# Patient Record
Sex: Female | Born: 1985 | Race: Black or African American | Hispanic: No | Marital: Single | Smoking: Never smoker
Health system: Southern US, Community
[De-identification: ages and names within clinical notes are randomized; demographics above are authoritative.]

## PROBLEM LIST (undated history)

## (undated) ENCOUNTER — Inpatient Hospital Stay (HOSPITAL_COMMUNITY): Payer: Self-pay

## (undated) DIAGNOSIS — Z3490 Encounter for supervision of normal pregnancy, unspecified, unspecified trimester: Secondary | ICD-10-CM

## (undated) DIAGNOSIS — Z789 Other specified health status: Secondary | ICD-10-CM

## (undated) DIAGNOSIS — A599 Trichomoniasis, unspecified: Secondary | ICD-10-CM

## (undated) HISTORY — PX: WISDOM TOOTH EXTRACTION: SHX21

---
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
## 2008-01-23 ENCOUNTER — Emergency Department (HOSPITAL_COMMUNITY): Admission: EM | Admit: 2008-01-23 | Discharge: 2008-01-23 | Payer: Self-pay | Admitting: Family Medicine

## 2008-02-22 ENCOUNTER — Emergency Department (HOSPITAL_COMMUNITY): Admission: EM | Admit: 2008-02-22 | Discharge: 2008-02-22 | Payer: Self-pay | Admitting: Emergency Medicine

## 2009-02-09 ENCOUNTER — Emergency Department (HOSPITAL_COMMUNITY): Admission: EM | Admit: 2009-02-09 | Discharge: 2009-02-09 | Payer: Self-pay | Admitting: Family Medicine

## 2009-09-03 ENCOUNTER — Emergency Department (HOSPITAL_COMMUNITY): Admission: EM | Admit: 2009-09-03 | Discharge: 2009-09-04 | Payer: Self-pay | Admitting: Emergency Medicine

## 2010-01-12 ENCOUNTER — Emergency Department (HOSPITAL_COMMUNITY): Admission: EM | Admit: 2010-01-12 | Discharge: 2010-01-12 | Payer: Self-pay | Admitting: Emergency Medicine

## 2010-03-13 ENCOUNTER — Inpatient Hospital Stay (HOSPITAL_COMMUNITY): Admission: AD | Admit: 2010-03-13 | Discharge: 2010-03-14 | Payer: Self-pay | Admitting: Obstetrics & Gynecology

## 2010-03-26 ENCOUNTER — Emergency Department (HOSPITAL_COMMUNITY): Admission: EM | Admit: 2010-03-26 | Discharge: 2010-03-26 | Payer: Self-pay | Admitting: Emergency Medicine

## 2010-06-24 ENCOUNTER — Ambulatory Visit: Payer: Self-pay | Admitting: Obstetrics and Gynecology

## 2010-06-24 LAB — CONVERTED CEMR LAB: Pap Smear: NEGATIVE

## 2010-06-25 ENCOUNTER — Encounter: Payer: Self-pay | Admitting: Family

## 2010-06-25 LAB — CONVERTED CEMR LAB
Prolactin: 45.2 ng/mL
TSH: 1.389 microintl units/mL (ref 0.350–4.500)

## 2010-06-27 ENCOUNTER — Inpatient Hospital Stay (HOSPITAL_COMMUNITY): Admission: AD | Admit: 2010-06-27 | Discharge: 2010-06-27 | Payer: Self-pay | Admitting: Obstetrics & Gynecology

## 2010-07-05 ENCOUNTER — Ambulatory Visit: Payer: Self-pay | Admitting: Advanced Practice Midwife

## 2010-07-05 ENCOUNTER — Inpatient Hospital Stay (HOSPITAL_COMMUNITY): Admission: AD | Admit: 2010-07-05 | Discharge: 2010-07-05 | Payer: Self-pay | Admitting: Obstetrics & Gynecology

## 2010-07-23 ENCOUNTER — Observation Stay (HOSPITAL_COMMUNITY): Admission: EM | Admit: 2010-07-23 | Discharge: 2010-07-23 | Payer: Self-pay | Admitting: Emergency Medicine

## 2010-08-06 ENCOUNTER — Ambulatory Visit: Payer: Self-pay | Admitting: Advanced Practice Midwife

## 2010-08-06 ENCOUNTER — Inpatient Hospital Stay (HOSPITAL_COMMUNITY): Admission: AD | Admit: 2010-08-06 | Discharge: 2010-08-06 | Payer: Self-pay | Admitting: Obstetrics and Gynecology

## 2010-09-10 ENCOUNTER — Ambulatory Visit (HOSPITAL_COMMUNITY): Admission: RE | Admit: 2010-09-10 | Discharge: 2010-09-10 | Payer: Self-pay | Admitting: Obstetrics

## 2010-09-24 ENCOUNTER — Inpatient Hospital Stay (HOSPITAL_COMMUNITY): Admission: AD | Admit: 2010-09-24 | Discharge: 2010-09-24 | Payer: Self-pay | Admitting: Obstetrics & Gynecology

## 2011-01-17 ENCOUNTER — Encounter: Payer: Self-pay | Admitting: Obstetrics & Gynecology

## 2011-02-08 ENCOUNTER — Inpatient Hospital Stay (HOSPITAL_COMMUNITY): Admission: AD | Admit: 2011-02-08 | Payer: Self-pay | Admitting: Obstetrics

## 2011-02-15 ENCOUNTER — Inpatient Hospital Stay (HOSPITAL_COMMUNITY)
Admission: AD | Admit: 2011-02-15 | Discharge: 2011-02-18 | DRG: 775 | Disposition: A | Discharge status: Home / Self Care | Payer: Medicaid Other | Source: Ambulatory Visit | Attending: Obstetrics | Admitting: Obstetrics

## 2011-02-15 DIAGNOSIS — O480 Post-term pregnancy: Principal | ICD-10-CM | POA: Diagnosis present

## 2011-02-15 DIAGNOSIS — O99892 Other specified diseases and conditions complicating childbirth: Secondary | ICD-10-CM | POA: Diagnosis present

## 2011-02-15 DIAGNOSIS — Z22330 Carrier of Group B streptococcus: Secondary | ICD-10-CM

## 2011-02-15 LAB — CBC
HCT: 34.6 % — ABNORMAL LOW (ref 36.0–46.0)
Hemoglobin: 11.2 g/dL — ABNORMAL LOW (ref 12.0–15.0)
MCV: 89.6 fL (ref 78.0–100.0)
RBC: 3.86 MIL/uL — ABNORMAL LOW (ref 3.87–5.11)

## 2011-02-16 LAB — RPR: RPR Ser Ql: NONREACTIVE

## 2011-02-17 LAB — CBC
Hemoglobin: 9 g/dL — ABNORMAL LOW (ref 12.0–15.0)
RBC: 3.08 MIL/uL — ABNORMAL LOW (ref 3.87–5.11)
RDW: 13.7 % (ref 11.5–15.5)
WBC: 21.7 10*3/uL — ABNORMAL HIGH (ref 4.0–10.5)

## 2011-03-11 LAB — CBC
Hemoglobin: 11.2 g/dL — ABNORMAL LOW (ref 12.0–15.0)
MCH: 32.3 pg (ref 26.0–34.0)
MCHC: 34.5 g/dL (ref 30.0–36.0)
Platelets: 234 10*3/uL (ref 150–400)
RBC: 3.48 MIL/uL — ABNORMAL LOW (ref 3.87–5.11)
RDW: 13.4 % (ref 11.5–15.5)

## 2011-03-11 LAB — COMPREHENSIVE METABOLIC PANEL
ALT: 25 U/L (ref 0–35)
AST: 27 U/L (ref 0–37)
Albumin: 3.4 g/dL — ABNORMAL LOW (ref 3.5–5.2)
Alkaline Phosphatase: 66 U/L (ref 39–117)
CO2: 25 mEq/L (ref 19–32)
GFR calc Af Amer: >60 mL/min (ref >60)
Sodium: 131 mEq/L — ABNORMAL LOW (ref 135–145)
Total Protein: 6.3 g/dL (ref 6.0–8.3)

## 2011-03-12 LAB — COMPREHENSIVE METABOLIC PANEL
AST: 19 U/L (ref 0–37)
Albumin: 4 g/dL (ref 3.5–5.2)
Alkaline Phosphatase: 59 U/L (ref 39–117)
CO2: 28 mEq/L (ref 19–32)
Calcium: 9.7 mg/dL (ref 8.4–10.5)
Chloride: 98 mEq/L (ref 96–112)
Creatinine, Ser: 0.65 mg/dL (ref 0.4–1.2)
GFR calc Af Amer: >60 mL/min (ref >60)
GFR calc non Af Amer: >60 mL/min (ref >60)
Potassium: 3.7 mEq/L (ref 3.5–5.1)

## 2011-03-12 LAB — CBC
Platelets: 279 10*3/uL (ref 150–400)
RDW: 12.2 % (ref 11.5–15.5)

## 2011-03-12 LAB — URINALYSIS, ROUTINE W REFLEX MICROSCOPIC
Hgb urine dipstick: NEGATIVE
Leukocytes, UA: NEGATIVE
Protein, ur: 100 mg/dL — AB
Specific Gravity, Urine: >1.03 — ABNORMAL HIGH (ref 1.005–1.030)
Urobilinogen, UA: 1 mg/dL (ref 0.0–1.0)
pH: 6 (ref 5.0–8.0)

## 2011-03-12 LAB — URINE MICROSCOPIC-ADD ON

## 2011-03-13 LAB — URINE MICROSCOPIC-ADD ON

## 2011-03-13 LAB — DIFFERENTIAL
Basophils Absolute: 0 10*3/uL (ref 0.0–0.1)
Basophils Relative: 0 % (ref 0–1)
Eosinophils Absolute: 0 10*3/uL (ref 0.0–0.7)

## 2011-03-13 LAB — CBC
HCT: 41.1 % (ref 36.0–46.0)
Hemoglobin: 14 g/dL (ref 12.0–15.0)
MCH: 31.2 pg (ref 26.0–34.0)
MCHC: 34 g/dL (ref 30.0–36.0)
Platelets: 256 10*3/uL (ref 150–400)

## 2011-03-13 LAB — URINALYSIS, ROUTINE W REFLEX MICROSCOPIC
Glucose, UA: NEGATIVE mg/dL
Hgb urine dipstick: NEGATIVE
Protein, ur: 30 mg/dL — AB
Specific Gravity, Urine: 1.035 — ABNORMAL HIGH (ref 1.005–1.030)
Urobilinogen, UA: 1 mg/dL (ref 0.0–1.0)
pH: 5.5 (ref 5.0–8.0)

## 2011-03-13 LAB — KETONES, QUALITATIVE: Acetone, Bld: NEGATIVE

## 2011-03-13 LAB — BASIC METABOLIC PANEL
CO2: 27 mEq/L (ref 19–32)
GFR calc Af Amer: >60 mL/min (ref >60)
GFR calc non Af Amer: >60 mL/min (ref >60)
Glucose, Bld: 98 mg/dL (ref 70–99)

## 2011-03-13 LAB — URINE CULTURE

## 2011-03-14 LAB — URINALYSIS, ROUTINE W REFLEX MICROSCOPIC
Bilirubin Urine: NEGATIVE
Glucose, UA: NEGATIVE mg/dL
Glucose, UA: NEGATIVE mg/dL
Hgb urine dipstick: NEGATIVE
Hgb urine dipstick: NEGATIVE
Ketones, ur: 15 mg/dL — AB
Nitrite: POSITIVE — AB
Protein, ur: NEGATIVE mg/dL
Specific Gravity, Urine: 1.025 (ref 1.005–1.030)
Specific Gravity, Urine: >1.03 — ABNORMAL HIGH (ref 1.005–1.030)

## 2011-03-14 LAB — WET PREP, GENITAL
Trich, Wet Prep: NONE SEEN
Yeast Wet Prep HPF POC: NONE SEEN

## 2011-03-14 LAB — POCT URINALYSIS DIP (DEVICE)
Ketones, ur: NEGATIVE mg/dL
Nitrite: NEGATIVE
Protein, ur: NEGATIVE mg/dL
pH: 6.5 (ref 5.0–8.0)

## 2011-03-14 LAB — URINE CULTURE

## 2011-03-14 LAB — URINE MICROSCOPIC-ADD ON

## 2011-03-14 LAB — GC/CHLAMYDIA PROBE AMP, GENITAL: GC Probe Amp, Genital: NEGATIVE

## 2011-03-21 LAB — URINALYSIS, ROUTINE W REFLEX MICROSCOPIC
Bilirubin Urine: NEGATIVE
Glucose, UA: NEGATIVE mg/dL
Hgb urine dipstick: NEGATIVE
Specific Gravity, Urine: 1.019 (ref 1.005–1.030)
Urobilinogen, UA: 1 mg/dL (ref 0.0–1.0)

## 2011-03-21 LAB — LIPASE, BLOOD: Lipase: 29 U/L (ref 11–59)

## 2011-03-21 LAB — CBC
MCHC: 34 g/dL (ref 30.0–36.0)
Platelets: 241 10*3/uL (ref 150–400)
RBC: 4.36 MIL/uL (ref 3.87–5.11)
RDW: 12.3 % (ref 11.5–15.5)

## 2011-03-21 LAB — DIFFERENTIAL
Basophils Absolute: 0.1 10*3/uL (ref 0.0–0.1)
Basophils Relative: 1 % (ref 0–1)
Eosinophils Absolute: 0 10*3/uL (ref 0.0–0.7)
Monocytes Relative: 6 % (ref 3–12)
Neutro Abs: 6.2 10*3/uL (ref 1.7–7.7)
Neutrophils Relative %: 72 % (ref 43–77)

## 2011-03-21 LAB — GC/CHLAMYDIA PROBE AMP, GENITAL: GC Probe Amp, Genital: NEGATIVE

## 2011-03-21 LAB — WET PREP, GENITAL
Trich, Wet Prep: NONE SEEN
Yeast Wet Prep HPF POC: NONE SEEN

## 2011-03-21 LAB — POCT I-STAT, CHEM 8
HCT: 43 % (ref 36.0–46.0)
Hemoglobin: 14.6 g/dL (ref 12.0–15.0)
Potassium: 3.8 mEq/L (ref 3.5–5.1)
Sodium: 140 mEq/L (ref 135–145)
TCO2: 29 mmol/L (ref 0–100)

## 2011-03-21 LAB — HEPATIC FUNCTION PANEL
Albumin: 4.1 g/dL (ref 3.5–5.2)
Indirect Bilirubin: 0.3 mg/dL (ref 0.3–0.9)
Total Protein: 7.2 g/dL (ref 6.0–8.3)

## 2011-03-21 LAB — PROTIME-INR: Prothrombin Time: 13.3 seconds (ref 11.6–15.2)

## 2011-03-21 LAB — HCG, QUANTITATIVE, PREGNANCY: hCG, Beta Chain, Quant, S: 1 m[IU]/mL (ref <5)

## 2011-04-02 LAB — POCT PREGNANCY, URINE: Preg Test, Ur: NEGATIVE

## 2011-04-02 LAB — URINE CULTURE: Colony Count: 75000

## 2011-04-02 LAB — URINALYSIS, ROUTINE W REFLEX MICROSCOPIC
Bilirubin Urine: NEGATIVE
Glucose, UA: NEGATIVE mg/dL
Ketones, ur: NEGATIVE mg/dL
Nitrite: NEGATIVE
Protein, ur: NEGATIVE mg/dL
pH: 5.5 (ref 5.0–8.0)

## 2011-04-02 LAB — URINE MICROSCOPIC-ADD ON

## 2011-04-02 LAB — WET PREP, GENITAL: Trich, Wet Prep: NONE SEEN

## 2011-04-02 LAB — GC/CHLAMYDIA PROBE AMP, GENITAL: GC Probe Amp, Genital: NEGATIVE

## 2011-04-13 LAB — POCT URINALYSIS DIP (DEVICE)
Bilirubin Urine: NEGATIVE
Glucose, UA: NEGATIVE mg/dL
Nitrite: NEGATIVE
Urobilinogen, UA: 1 mg/dL (ref 0.0–1.0)

## 2011-04-13 LAB — GC/CHLAMYDIA PROBE AMP, GENITAL
Chlamydia, DNA Probe: NEGATIVE
GC Probe Amp, Genital: NEGATIVE

## 2011-04-13 LAB — POCT PREGNANCY, URINE: Preg Test, Ur: NEGATIVE

## 2011-04-13 LAB — WET PREP, GENITAL: Trich, Wet Prep: NONE SEEN

## 2011-09-16 LAB — WET PREP, GENITAL: Yeast Wet Prep HPF POC: NONE SEEN

## 2013-07-08 ENCOUNTER — Inpatient Hospital Stay (HOSPITAL_COMMUNITY)
Admission: AD | Admit: 2013-07-08 | Discharge: 2013-07-08 | Disposition: A | Discharge status: Home / Self Care | Payer: Medicaid Other | Source: Ambulatory Visit | Attending: Family Medicine | Admitting: Family Medicine

## 2013-07-08 ENCOUNTER — Encounter (HOSPITAL_COMMUNITY): Payer: Self-pay | Admitting: *Deleted

## 2013-07-08 DIAGNOSIS — Z3201 Encounter for pregnancy test, result positive: Secondary | ICD-10-CM | POA: Insufficient documentation

## 2013-07-08 LAB — POCT PREGNANCY, URINE: Preg Test, Ur: POSITIVE — AB

## 2013-07-08 NOTE — MAU Note (Signed)
I'm 6 wks late on my period and having some different white vag d/c. No pain.

## 2013-07-08 NOTE — MAU Provider Note (Signed)
27 y.o. G2P1 at [redacted]w[redacted]d by LMP here requesting pregnancy test. No pain or bleeding.   BP 117/71  Pulse 103  Temp(Src) 97.5 F (36.4 C)  Resp 18  Ht 5\' 2"  (1.575 m)  Wt 150 lb (68.04 kg)  BMI 27.43 kg/m2  LMP 06/05/2013  Gen: well, no distress  Results for orders placed during the hospital encounter of 07/08/13 (from the past 24 hour(s))  POCT PREGNANCY, URINE     Status: Abnormal   Collection Time    07/08/13  7:24 AM      Result Value Range   Preg Test, Ur POSITIVE (*) NEGATIVE    A/P: 1. Positive urine pregnancy test   Precautions rev'd, start [facility] ASAP, verification letter and list of providers given    Medication List    Notice   You have not been prescribed any medications.          Follow-up Information   Follow up with [facility] [place]. (start prenatal care as soon as possible)    Contact information:   [address] [place] [address] [phone]

## 2013-07-08 NOTE — ED Notes (Signed)
Pregnancy confirmation letter given and list of providers to pt by N.Frazier,CNM.

## 2013-07-24 ENCOUNTER — Encounter (HOSPITAL_COMMUNITY): Payer: Self-pay | Admitting: Emergency Medicine

## 2013-07-24 ENCOUNTER — Emergency Department (HOSPITAL_COMMUNITY)
Admission: EM | Admit: 2013-07-24 | Discharge: 2013-07-24 | Disposition: A | Discharge status: Home / Self Care | Payer: Medicaid Other | Attending: Emergency Medicine | Admitting: Emergency Medicine

## 2013-07-24 DIAGNOSIS — N898 Other specified noninflammatory disorders of vagina: Secondary | ICD-10-CM | POA: Insufficient documentation

## 2013-07-24 DIAGNOSIS — E860 Dehydration: Secondary | ICD-10-CM

## 2013-07-24 DIAGNOSIS — O210 Mild hyperemesis gravidarum: Secondary | ICD-10-CM | POA: Insufficient documentation

## 2013-07-24 DIAGNOSIS — O219 Vomiting of pregnancy, unspecified: Secondary | ICD-10-CM

## 2013-07-24 HISTORY — DX: Encounter for supervision of normal pregnancy, unspecified, unspecified trimester: Z34.90

## 2013-07-24 LAB — POCT I-STAT, CHEM 8
BUN: 18 mg/dL (ref 6–23)
Calcium, Ion: 1.1 mmol/L — ABNORMAL LOW (ref 1.12–1.23)
Chloride: 92 mEq/L — ABNORMAL LOW (ref 96–112)

## 2013-07-24 LAB — URINALYSIS, ROUTINE W REFLEX MICROSCOPIC
Glucose, UA: NEGATIVE mg/dL
Ketones, ur: 15 mg/dL — AB
Specific Gravity, Urine: 1.036 — ABNORMAL HIGH (ref 1.005–1.030)
pH: 5.5 (ref 5.0–8.0)

## 2013-07-24 LAB — URINE MICROSCOPIC-ADD ON

## 2013-07-24 MED ORDER — ONDANSETRON 4 MG PO TBDP: 4.0000 mg | ORAL_TABLET | Freq: Three times a day (TID) | ORAL | Status: DC | PRN

## 2013-07-24 MED ORDER — SODIUM CHLORIDE 0.9 % IV BOLUS (SEPSIS)
1000.0000 mL | Freq: Once | INTRAVENOUS | Status: AC
  Administered 2013-07-24: 1000 mL via INTRAVENOUS

## 2013-07-24 MED ORDER — POTASSIUM CHLORIDE CRYS ER 20 MEQ PO TBCR
40.0000 meq | EXTENDED_RELEASE_TABLET | Freq: Once | ORAL | Status: AC
  Administered 2013-07-24: 40 meq via ORAL
  Filled 2013-07-24: qty 2

## 2013-07-24 MED ORDER — ONDANSETRON HCL 4 MG/2ML IJ SOLN
4.0000 mg | Freq: Once | INTRAMUSCULAR | Status: AC
  Administered 2013-07-24: 4 mg via INTRAVENOUS
  Filled 2013-07-24: qty 2

## 2013-07-24 NOTE — ED Provider Notes (Signed)
Medical screening examination/treatment/procedure(s) were performed by non-physician practitioner and as supervising physician I was immediately available for consultation/collaboration.   Gwyneth Sprout, MD 07/24/13 [phone]

## 2013-07-24 NOTE — ED Provider Notes (Signed)
CSN: [id number]     Arrival date & time 07/24/13  1238 History     First MD Initiated Contact with Patient 07/24/13 1459     Chief Complaint  Patient presents with  . Nausea   (Consider location/radiation/quality/duration/timing/severity/associated sxs/prior Treatment) HPI Comments: Patient who is [redacted] weeks pregnant presents with complaint of persistent vomiting for the past 2 weeks. She's been unable to keep down solids and liquids. She reports having a significant amount of vomiting with previous pregnancies. Symptoms occur all day and are worse in the afternoon. She denies chest pain, abdominal pain. She denies vaginal bleeding. She is a small amount of white discharge. No treatments prior to arrival. Onset of symptoms acute. Course is intermittent. Nothing makes symptoms better or worse.  The history is provided by the patient.    Past Medical History  Diagnosis Date  . Pregnant    History reviewed. No pertinent past surgical history. No family history on file. History  Substance Use Topics  . Smoking status: Not on file  . Smokeless tobacco: Not on file  . Alcohol Use: No   OB History   Grav Para Term Preterm Abortions TAB SAB Ect Mult Living   1              Review of Systems  Constitutional: Negative for fever.  HENT: Negative for sore throat and rhinorrhea.   Eyes: Negative for redness.  Respiratory: Negative for cough.   Cardiovascular: Negative for chest pain.  Gastrointestinal: Positive for nausea and vomiting. Negative for abdominal pain and diarrhea.  Genitourinary: Positive for vaginal discharge. Negative for dysuria, frequency, flank pain and vaginal bleeding.  Musculoskeletal: Negative for myalgias.  Skin: Negative for rash.  Neurological: Negative for headaches.    Allergies  Review of patient's allergies indicates no known allergies.  Home Medications  No current outpatient prescriptions on file. BP 112/83  Pulse 111  Temp(Src) 98.5 F (36.9 C)  (Oral)  Resp 15  SpO2 97%  LMP 06/05/2013 Physical Exam  Nursing note and vitals reviewed. Constitutional: She appears well-developed and well-nourished.  HENT:  Head: Normocephalic and atraumatic.  Eyes: Conjunctivae are normal. Pupils are equal, round, and reactive to light. Right eye exhibits no discharge. Left eye exhibits no discharge.  Neck: Normal range of motion. Neck supple.  Cardiovascular: Normal rate, regular rhythm and normal heart sounds.   Pulmonary/Chest: Effort normal and breath sounds normal. No respiratory distress. She has no wheezes. She has no rales.  Abdominal: Soft. There is no tenderness. There is no rebound and no guarding.  Neurological: She is alert.  Skin: Skin is warm and dry.  Psychiatric: She has a normal mood and affect.    ED Course   Procedures (including critical care time)  Labs Reviewed  URINALYSIS, ROUTINE W REFLEX MICROSCOPIC - Abnormal; Notable for the following:    APPearance CLOUDY (*)    Specific Gravity, Urine 1.036 (*)    Bilirubin Urine SMALL (*)    Ketones, ur 15 (*)    Protein, ur 100 (*)    Leukocytes, UA SMALL (*)    All other components within normal limits  URINE MICROSCOPIC-ADD ON - Abnormal; Notable for the following:    Squamous Epithelial / LPF FEW (*)    Bacteria, UA FEW (*)    Casts HYALINE CASTS (*)    All other components within normal limits  POCT I-STAT, CHEM 8 - Abnormal; Notable for the following:    Potassium 2.8 (*)  Chloride 92 (*)    Glucose, Bld 119 (*)    Calcium, Ion 1.10 (*)    Hemoglobin 17.0 (*)    HCT 50.0 (*)    All other components within normal limits  URINE CULTURE  URINALYSIS, ROUTINE W REFLEX MICROSCOPIC   No results found. 1. Vomiting of pregnancy   2. Dehydration     4:11 PM Patient seen and examined. Work-up initiated. Medications ordered.   Vital signs reviewed and are as follows: Filed Vitals:   07/24/13 1256  BP: 112/83  Pulse: 111  Temp: 98.5 F (36.9 C)  Resp: 15    6:44 PM Patient improved. She has received 2L of IV NS. She is drinking in room and has not vomited. She is agreeable to d/c to home. She will f/u with OB as planned. Counseled on b.r.a.t. diet. Urged to start taking pre-natal vitamin.   The patient was urged to return to the Emergency Department immediately with worsening of current symptoms, worsening abdominal pain, persistent vomiting, blood noted in stools, fever, or any other concerns. The patient verbalized understanding.   MDM  Dehydration 2/2 hyperemesis gravidarum controlled in ED with zofran. She is now tolerating PO's. She does have elevated hgb and spec grav 2/2 dehydration --> 2L NS, control of vomiting. She also has hypokalemia and hypochloridemia. Given PO potassium in ED. Improved PO intake and pre-natal vitamins should help these deficits. Pt appears improved. She feels improved. At this point, no indications for admission. She has OB f/u. Vitals improved. Abd remains soft, NT. No vaginal bleeding or pelvic pain.    Renne Crigler, PA-C 07/24/13 1850

## 2013-07-24 NOTE — ED Notes (Signed)
Tolerated oral fluids and crackers.

## 2013-07-24 NOTE — ED Notes (Signed)
Pt. Stated, I've not had anything to eat in 2 weeks, I know I'm pregnant and my appt is not til Aug. 14.  I need to have some fluids.

## 2013-07-26 LAB — URINE CULTURE

## 2013-07-27 ENCOUNTER — Telehealth (HOSPITAL_COMMUNITY): Payer: Self-pay | Admitting: *Deleted

## 2013-07-27 NOTE — Progress Notes (Addendum)
ED Antimicrobial Stewardship Positive Culture Follow Up   Catrice Zuleta Kissel is an 27 y.o. female who presented to [facility] on 07/24/2013 with a chief complaint of  Chief Complaint  Patient presents with  . Nausea    Recent Results (from the past 720 hour(s))  URINE CULTURE     Status: None   Collection Time    07/24/13  3:26 PM      Result Value Range Status   Specimen Description URINE, CLEAN CATCH   Final   Special Requests NONE   Final   Culture  Setup Time 07/24/2013 21:39   Final   Colony Count >=100,000 COLONIES/ML   Final   Culture ENTEROCOCCUS SPECIES   Final   Report Status 07/26/2013 FINAL   Final   Organism ID, Bacteria ENTEROCOCCUS SPECIES   Final    []  Patient discharged originally without antimicrobial agent and treatment is now indicated  New antibiotic prescription: Amoxicillin 500mg  PO TID x 7 days  ED Provider: Magnus Sinning, PA-C   Sallee Provencal 07/27/2013, 10:56 AM Infectious Diseases Pharmacist Phone# [phone]

## 2013-07-27 NOTE — ED Notes (Signed)
Post ED Visit - Positive Culture Follow-up: Successful Patient Follow-Up  Culture assessed and recommendations reviewed by: []  Wes Kathryne Eriksson, Pharm.D., BCPS []  Celedonio Miyamoto, Pharm.D., BCPS []  Georgina Pillion, [address].D., BCPS []  [place], [address].D., BCPS, AAHIVP [x]  Estella Husk, Pharm.D., BCPS, AAHIVP  Positive urine culture  []  Patient discharged without antimicrobial prescription and treatment is now indicated [x]  Organism is resistant to prescribed ED discharge antimicrobial []  Patient with positive blood cultures  Changes discussed with ED provider: Pascal Lux Wingen New antibiotic prescription -Amoxicillin 500 mg TID x 7 days ,21 capsules      Larena Sox 07/27/2013, 2:28 PM

## 2013-07-29 ENCOUNTER — Telehealth (HOSPITAL_COMMUNITY): Payer: Self-pay | Admitting: Emergency Medicine

## 2013-08-01 ENCOUNTER — Encounter (HOSPITAL_COMMUNITY): Payer: Self-pay | Admitting: Anesthesiology

## 2013-08-01 ENCOUNTER — Inpatient Hospital Stay (HOSPITAL_COMMUNITY)
Admission: AD | Admit: 2013-08-01 | Discharge: 2013-08-05 | DRG: 781 | Disposition: A | Discharge status: Home / Self Care | Payer: Medicaid Other | Source: Ambulatory Visit | Attending: Obstetrics & Gynecology | Admitting: Obstetrics & Gynecology

## 2013-08-01 ENCOUNTER — Encounter (HOSPITAL_COMMUNITY): Payer: Self-pay | Admitting: *Deleted

## 2013-08-01 DIAGNOSIS — E876 Hypokalemia: Secondary | ICD-10-CM | POA: Diagnosis present

## 2013-08-01 DIAGNOSIS — R634 Abnormal weight loss: Secondary | ICD-10-CM | POA: Diagnosis present

## 2013-08-01 DIAGNOSIS — R339 Retention of urine, unspecified: Secondary | ICD-10-CM | POA: Diagnosis present

## 2013-08-01 DIAGNOSIS — E43 Unspecified severe protein-calorie malnutrition: Secondary | ICD-10-CM | POA: Insufficient documentation

## 2013-08-01 DIAGNOSIS — O99891 Other specified diseases and conditions complicating pregnancy: Secondary | ICD-10-CM | POA: Diagnosis present

## 2013-08-01 DIAGNOSIS — R1110 Vomiting, unspecified: Secondary | ICD-10-CM

## 2013-08-01 DIAGNOSIS — O211 Hyperemesis gravidarum with metabolic disturbance: Secondary | ICD-10-CM

## 2013-08-01 DIAGNOSIS — O210 Mild hyperemesis gravidarum: Principal | ICD-10-CM | POA: Diagnosis present

## 2013-08-01 HISTORY — DX: Trichomoniasis, unspecified: A59.9

## 2013-08-01 LAB — URINE MICROSCOPIC-ADD ON

## 2013-08-01 LAB — URINALYSIS, ROUTINE W REFLEX MICROSCOPIC
Ketones, ur: 15 mg/dL — AB
Leukocytes, UA: NEGATIVE
Protein, ur: 100 mg/dL — AB
Urobilinogen, UA: 1 mg/dL (ref 0.0–1.0)

## 2013-08-01 MED ORDER — PROMETHAZINE HCL 25 MG/ML IJ SOLN
25.0000 mg | Freq: Once | INTRAMUSCULAR | Status: AC
  Administered 2013-08-01: 25 mg via INTRAVENOUS
  Filled 2013-08-01: qty 1

## 2013-08-01 MED ORDER — ACETAMINOPHEN 325 MG PO TABS: 650.0000 mg | ORAL_TABLET | ORAL | Status: DC | PRN

## 2013-08-01 MED ORDER — PRENATAL MULTIVITAMIN CH
1.0000 | ORAL_TABLET | Freq: Every day | ORAL | Status: DC
  Administered 2013-08-04: 1 via ORAL
  Filled 2013-08-01: qty 1

## 2013-08-01 MED ORDER — DOCUSATE SODIUM 100 MG PO CAPS
100.0000 mg | ORAL_CAPSULE | Freq: Every day | ORAL | Status: DC
  Administered 2013-08-02 – 2013-08-04 (×2): 100 mg via ORAL
  Filled 2013-08-01 (×2): qty 1

## 2013-08-01 MED ORDER — CALCIUM CARBONATE ANTACID 500 MG PO CHEW: 2.0000 | CHEWABLE_TABLET | ORAL | Status: DC | PRN

## 2013-08-01 MED ORDER — ZOLPIDEM TARTRATE 5 MG PO TABS: 5.0000 mg | ORAL_TABLET | Freq: Every evening | ORAL | Status: DC | PRN

## 2013-08-01 MED ORDER — LACTATED RINGERS IV SOLN
INTRAVENOUS | Status: DC
  Administered 2013-08-01: 17:00:00 via INTRAVENOUS

## 2013-08-01 NOTE — ED Notes (Signed)
Patient admitted to [facility]

## 2013-08-01 NOTE — MAU Provider Note (Signed)
History     CSN: [id number]  Arrival date and time: 08/01/13 1356   First Provider Initiated Contact with Patient 08/01/13 1522      Chief Complaint  Patient presents with  . Dehydration  . Urinary Retention  . Weight Loss   HPI [name] is a 27 y.o. G1P0 at [redacted]w[redacted]d who presents to MAU today with N/V. The patient states that she was seen at [facility] recently for the same problem and given Zofran. She has been taking this, but it has not helped her symptoms. She has had very little if any food or liquid intake. She states that she has had headaches, dizziness, weight loss and decreased urine output. She denies fever or abdominal pain.   OB History   Grav Para Term Preterm Abortions TAB SAB Ect Mult Living   2 1 1       1       Past Medical History  Diagnosis Date  . Pregnant   . Trichomonas     Past Surgical History  Procedure Laterality Date  . Wisdom tooth extraction      History reviewed. No pertinent family history.  History  Substance Use Topics  . Smoking status: Never Smoker   . Smokeless tobacco: Never Used  . Alcohol Use: Yes     Comment: Social prior to preg.    Allergies: No Known Allergies  Prescriptions prior to admission  Medication Sig Dispense Refill  . ondansetron (ZOFRAN ODT) 4 MG disintegrating tablet Take 1 tablet (4 mg total) by mouth every 8 (eight) hours as needed for nausea.  12 tablet  0  . OVER THE COUNTER MEDICATION Take 1 each by mouth 3 (three) times daily as needed (for nausea/vomiting.  PREGGIE POPS.).        Review of Systems  Constitutional: Negative for fever and malaise/fatigue.  Gastrointestinal: Positive for nausea and vomiting. Negative for abdominal pain, diarrhea and constipation.  Neurological: Positive for dizziness and weakness. Negative for loss of consciousness.   Physical Exam   Blood pressure 121/86, pulse 130, temperature 98.5 F (36.9 C), resp. rate 20, height 5\' 2"  (1.575 m), weight 133 lb 2 oz (60.385  kg), last menstrual period 06/05/2013.  Physical Exam  Constitutional: She is oriented to person, place, and time. She appears well-developed and well-nourished. No distress.  HENT:  Head: Normocephalic and atraumatic.  Cardiovascular: Normal rate, regular rhythm and normal heart sounds.   Respiratory: Effort normal and breath sounds normal. No respiratory distress.  GI: Soft. She exhibits no distension and no mass. Bowel sounds are decreased. There is no tenderness. There is no rebound and no guarding.  Neurological: She is alert and oriented to person, place, and time.  Skin: Skin is warm and dry. No erythema.  Psychiatric: She has a normal mood and affect.   Results for orders placed during the hospital encounter of 08/01/13 (from the past 24 hour(s))  URINALYSIS, ROUTINE W REFLEX MICROSCOPIC     Status: Abnormal   Collection Time    08/01/13  2:57 PM      Result Value Range   Color, Urine YELLOW  YELLOW   APPearance HAZY (*) CLEAR   Specific Gravity, Urine >1.030 (*) 1.005 - 1.030   pH 6.0  5.0 - 8.0   Glucose, UA NEGATIVE  NEGATIVE mg/dL   Hgb urine dipstick TRACE (*) NEGATIVE   Bilirubin Urine MODERATE (*) NEGATIVE   Ketones, ur 15 (*) NEGATIVE mg/dL   Protein, ur 308 (*)  NEGATIVE mg/dL   Urobilinogen, UA 1.0  0.0 - 1.0 mg/dL   Nitrite NEGATIVE  NEGATIVE   Leukocytes, UA NEGATIVE  NEGATIVE  URINE MICROSCOPIC-ADD ON     Status: Abnormal   Collection Time    08/01/13  2:57 PM      Result Value Range   Squamous Epithelial / LPF MANY (*) RARE   WBC, UA 3-6  <3 WBC/hpf   RBC / HPF 0-2  <3 RBC/hpf   Bacteria, UA MANY (*) RARE   Urine-Other MUCOUS PRESENT      MAU Course  Procedures None  MDM 1 liter IV phenergan infusion in LR given - patient is resting comfortably Discussed with Dr. Tamela Oddi. Admit to Women's Unit for further management of hyperemesis  Assessment and Plan  A: Hyperemesis  P: Admit to Women's Unit for further management  Freddi Starr,  PA-C  08/01/2013, 5:05 PM

## 2013-08-01 NOTE — MAU Note (Signed)
One week ago at [facility] for same S/S, cannot eat, cannot urinate, Was 150# when here 2 wks ago for preg test, now 133.2 #  unable to urinate (only drops), cannot identify time when she was able to eat, has appt Aug 14th Emesis in triage, all day emesis, cannot define

## 2013-08-02 ENCOUNTER — Ambulatory Visit (HOSPITAL_COMMUNITY): Payer: Medicaid Other

## 2013-08-02 DIAGNOSIS — O210 Mild hyperemesis gravidarum: Secondary | ICD-10-CM | POA: Diagnosis present

## 2013-08-02 LAB — BASIC METABOLIC PANEL
BUN: 7 mg/dL (ref 6–23)
CO2: 33 mEq/L — ABNORMAL HIGH (ref 19–32)
Chloride: 92 mEq/L — ABNORMAL LOW (ref 96–112)
Creatinine, Ser: 0.65 mg/dL (ref 0.50–1.10)

## 2013-08-02 LAB — COMPREHENSIVE METABOLIC PANEL
ALT: 5 U/L (ref 0–35)
Albumin: 2.9 g/dL — ABNORMAL LOW (ref 3.5–5.2)
Alkaline Phosphatase: 55 U/L (ref 39–117)
Potassium: 2.1 mEq/L — CL (ref 3.5–5.1)
Sodium: 131 mEq/L — ABNORMAL LOW (ref 135–145)
Total Protein: 5.9 g/dL — ABNORMAL LOW (ref 6.0–8.3)

## 2013-08-02 LAB — CBC
MCHC: 36 g/dL (ref 30.0–36.0)
RDW: 11.5 % (ref 11.5–15.5)

## 2013-08-02 MED ORDER — POTASSIUM CHLORIDE 2 MEQ/ML IV SOLN
Freq: Once | INTRAVENOUS | Status: AC
  Administered 2013-08-02: 08:00:00 via INTRAVENOUS
  Filled 2013-08-02: qty 1000

## 2013-08-02 MED ORDER — DEXTROSE-NACL 5-0.45 % IV SOLN
Freq: Two times a day (BID) | INTRAVENOUS | Status: DC
  Administered 2013-08-02: 23:00:00 via INTRAVENOUS

## 2013-08-02 MED ORDER — ONDANSETRON HCL 4 MG/2ML IJ SOLN
4.0000 mg | Freq: Four times a day (QID) | INTRAMUSCULAR | Status: DC | PRN
  Administered 2013-08-02: 4 mg via INTRAVENOUS
  Filled 2013-08-02: qty 2

## 2013-08-02 MED ORDER — SODIUM CHLORIDE 0.9 % IV SOLN: 25.0000 mg | Freq: Four times a day (QID) | INTRAVENOUS | Status: DC | PRN

## 2013-08-02 MED ORDER — DEXTROSE-NACL 5-0.45 % IV SOLN
INTRAVENOUS | Status: AC
  Administered 2013-08-02: 10:00:00 via INTRAVENOUS

## 2013-08-02 MED ORDER — POTASSIUM CHLORIDE CRYS ER 20 MEQ PO TBCR
40.0000 meq | EXTENDED_RELEASE_TABLET | Freq: Three times a day (TID) | ORAL | Status: DC
  Filled 2013-08-02 (×4): qty 2

## 2013-08-02 MED ORDER — POTASSIUM CHLORIDE 10 MEQ/100ML IV SOLN
10.0000 meq | INTRAVENOUS | Status: AC
  Administered 2013-08-02 (×4): 10 meq via INTRAVENOUS
  Filled 2013-08-02 (×4): qty 100

## 2013-08-02 MED ORDER — M.V.I. ADULT IV INJ
INTRAVENOUS | Status: DC
  Filled 2013-08-02: qty 1000

## 2013-08-02 MED ORDER — KCL IN DEXTROSE-NACL 40-5-0.45 MEQ/L-%-% IV SOLN: INTRAVENOUS | Status: DC

## 2013-08-02 MED ORDER — THIAMINE HCL 100 MG/ML IJ SOLN
Freq: Once | INTRAVENOUS | Status: AC
  Administered 2013-08-02: 04:00:00 via INTRAVENOUS
  Filled 2013-08-02: qty 1000

## 2013-08-02 MED ORDER — DEXTROSE 5 % IV SOLN
1.0000 g | Freq: Two times a day (BID) | INTRAVENOUS | Status: DC
  Administered 2013-08-02 (×2): 1 g via INTRAVENOUS
  Filled 2013-08-02 (×3): qty 10

## 2013-08-02 MED ORDER — METOCLOPRAMIDE HCL 5 MG/ML IJ SOLN
10.0000 mg | Freq: Three times a day (TID) | INTRAMUSCULAR | Status: DC | PRN
  Administered 2013-08-02 (×3): 10 mg via INTRAVENOUS
  Filled 2013-08-02 (×3): qty 2

## 2013-08-02 MED ORDER — POTASSIUM CHLORIDE CRYS ER 20 MEQ PO TBCR
20.0000 meq | EXTENDED_RELEASE_TABLET | Freq: Two times a day (BID) | ORAL | Status: DC
  Administered 2013-08-02: 20 meq via ORAL
  Filled 2013-08-02: qty 1

## 2013-08-02 NOTE — Progress Notes (Signed)
UR completed 

## 2013-08-02 NOTE — Progress Notes (Signed)
INITIAL NUTRITION ASSESSMENT  DOCUMENTATION CODES Per approved criteria  -Severe malnutrition in the context of acute illness or injury   INTERVENTION:  Continue solid diet.  Snacks TID between meals.  Morning sickness handout provided.  If nausea/vomiting persists, may need to consider placing NGT for tube feeding: Jevity 1.2 at 10 ml/h, increase by 10 ml every 4 hours to goal rate of 60 ml/h to provide 1728 kcals, 80 gm protein, 1166 ml free water daily.  NUTRITION DIAGNOSIS: Malnutrition related to altered GI function as evidenced by 9% weight loss in the past month and intake </= 50% of estimated energy requirement for >/= 5 days.   Goal: Intake to meet >90% of estimated nutrition needs.  Monitor:  PO intake, weight trend.  Reason for Assessment: Nutrition Risk; MD Consult  27 y.o. female  Admitting Dx: Hyperemesis  ASSESSMENT: Patient reports nausea and vomiting for the past 2 weeks. Unable to keep much food down. Reports that she has lost 18 lb in the past 2 weeks. Nausea has improved some since admission. Has been eating mostly fruits: grapes, melon, applesauce.  Height: Ht Readings from Last 1 Encounters:  08/01/13 5\' 2"  (1.575 m)    Weight: Wt Readings from Last 1 Encounters:  08/02/13 136 lb (61.689 kg)    Ideal Body Weight: 50 kg  % Ideal Body Weight: 123%  Wt Readings from Last 10 Encounters:  08/02/13 136 lb (61.689 kg)  07/08/13 150 lb (68.04 kg)    Usual Body Weight: 150 lb  % Usual Body Weight: 91%  BMI:  Body mass index is 24.87 kg/(m^2).  Estimated Nutritional Needs: Kcal: 1700-1900 Protein: 70-85 gm Fluid: 1.7-1.9 L  Skin: no problems  Diet Order: Parke Simmers  EDUCATION NEEDS: -Education needs addressed-provided handout on morning sickness   Intake/Output Summary (Last 24 hours) at 08/02/13 1402 Last data filed at 08/02/13 1200  Gross per 24 hour  Intake    600 ml  Output   1175 ml  Net   -575 ml    Labs:   Recent  Labs Lab 08/02/13 0535  NA 131*  K 2.1*  CL 88*  CO2 32  BUN 11  CREATININE 0.62  CALCIUM 8.8  MG 2.2  GLUCOSE 133*    CBG (last 3)  No results found for this basename: GLUCAP,  in the last 72 hours  Scheduled Meds: . cefTRIAXone (ROCEPHIN)  IV  1 g Intravenous Q12H  . [START ON 08/03/2013] dextrose 5 % and 0.45% NaCl 1,000 mL with multivitamins adult (MVI -12) 10 mL infusion   Intravenous Q24H   And  . dextrose 5 % and 0.45% NaCl   Intravenous BID  . docusate sodium  100 mg Oral Daily  . potassium chloride  40 mEq Oral TID  . prenatal multivitamin  1 tablet Oral Q1200    Continuous Infusions: . dextrose 5 % and 0.45% NaCl 50 mL/hr at 08/02/13 1008    Past Medical History  Diagnosis Date  . Pregnant   . Trichomonas     Past Surgical History  Procedure Laterality Date  . Wisdom tooth extraction      Joaquin Courts, RD, LDN, CNSC Pager [phone] After Hours Pager [phone]

## 2013-08-02 NOTE — Plan of Care (Signed)
Problem: Phase I Progression Outcomes Goal: Hemodynamically stable K+ 2.1 on admission,  Unable to tolerate K+ PO.  Tolerated K+ runs Iv at 40/cc/hr. K+ 2.6 at 1400  Still tolerating K= runs 3rd bag still running

## 2013-08-02 NOTE — Progress Notes (Signed)
Patient ID: GIABELLA DUHART, female   DOB: 07-02-1986, 27 y.o.   MRN: [id number] Subjective: Interval History: less nausea  Objective: Vital signs in last 24 hours: Temp:  [98.2 F (36.8 C)-98.8 F (37.1 C)] 98.2 F (36.8 C) (08/07 0546) Pulse Rate:  [83-130] 83 (08/07 0546) Resp:  [18-20] 18 (08/07 0546) BP: (90-121)/(61-86) 105/74 mmHg (08/07 0546) SpO2:  [98 %-100 %] 98 % (08/07 0546) Weight:  [133 lb 2 oz (60.385 kg)-136 lb (61.689 kg)] 136 lb (61.689 kg) (08/07 0546)  Intake/Output from previous day: 08/06 0701 - 08/07 0700 In: 360 [P.O.:360] Out: 200 [Urine:200] Intake/Output this shift: Total I/O In: -  Out: 100 [Urine:100]  Abd: NT  Results for orders placed during the hospital encounter of 08/01/13 (from the past 24 hour(s))  URINALYSIS, ROUTINE W REFLEX MICROSCOPIC     Status: Abnormal   Collection Time    08/01/13  2:57 PM      Result Value Range   Color, Urine YELLOW  YELLOW   APPearance HAZY (*) CLEAR   Specific Gravity, Urine >1.030 (*) 1.005 - 1.030   pH 6.0  5.0 - 8.0   Glucose, UA NEGATIVE  NEGATIVE mg/dL   Hgb urine dipstick TRACE (*) NEGATIVE   Bilirubin Urine MODERATE (*) NEGATIVE   Ketones, ur 15 (*) NEGATIVE mg/dL   Protein, ur 409 (*) NEGATIVE mg/dL   Urobilinogen, UA 1.0  0.0 - 1.0 mg/dL   Nitrite NEGATIVE  NEGATIVE   Leukocytes, UA NEGATIVE  NEGATIVE  URINE MICROSCOPIC-ADD ON     Status: Abnormal   Collection Time    08/01/13  2:57 PM      Result Value Range   Squamous Epithelial / LPF MANY (*) RARE   WBC, UA 3-6  <3 WBC/hpf   RBC / HPF 0-2  <3 RBC/hpf   Bacteria, UA MANY (*) RARE   Urine-Other MUCOUS PRESENT    CBC     Status: Abnormal   Collection Time    08/02/13  5:35 AM      Result Value Range   WBC 11.3 (*) 4.0 - 10.5 K/uL   RBC 4.03  3.87 - 5.11 MIL/uL   Hemoglobin 12.0  12.0 - 15.0 g/dL   HCT 81.1 (*) 91.4 - 78.2 %   MCV 82.6  78.0 - 100.0 fL   MCH 29.8  26.0 - 34.0 pg   MCHC 36.0  30.0 - 36.0 g/dL   RDW 95.6  21.3 -  08.6 %   Platelets 252  150 - 400 K/uL  COMPREHENSIVE METABOLIC PANEL     Status: Abnormal   Collection Time    08/02/13  5:35 AM      Result Value Range   Sodium 131 (*) 135 - 145 mEq/L   Potassium 2.1 (*) 3.5 - 5.1 mEq/L   Chloride 88 (*) 96 - 112 mEq/L   CO2 32  19 - 32 mEq/L   Glucose, Bld 133 (*) 70 - 99 mg/dL   BUN 11  6 - 23 mg/dL   Creatinine, Ser 5.78  0.50 - 1.10 mg/dL   Calcium 8.8  8.4 - 46.9 mg/dL   Total Protein 5.9 (*) 6.0 - 8.3 g/dL   Albumin 2.9 (*) 3.5 - 5.2 g/dL   AST 10  0 - 37 U/L   ALT 5  0 - 35 U/L   Alkaline Phosphatase 55  39 - 117 U/L   Total Bilirubin 0.4  0.3 - 1.2 mg/dL   GFR  calc non Af Amer >90  >90 mL/min   GFR calc Af Amer >90  >90 mL/min  MAGNESIUM     Status: None   Collection Time    08/02/13  5:35 AM      Result Value Range   Magnesium 2.2  1.5 - 2.5 mg/dL    Studies/Results: No results found.  Scheduled Meds: . [START ON 08/03/2013] dextrose 5 % and 0.45% NaCl 1,000 mL with multivitamins adult (MVI -12) 10 mL infusion   Intravenous Q24H   And  . dextrose 5 % and 0.45% NaCl   Intravenous BID  . docusate sodium  100 mg Oral Daily  . potassium chloride  20 mEq Oral BID  . prenatal multivitamin  1 tablet Oral Q1200   Continuous Infusions:  PRN Meds:acetaminophen, calcium carbonate, metoCLOPramide (REGLAN) injection, ondansetron (ZOFRAN) IV, zolpidem  Assessment/Plan: Present on Admission:  . Hyperemesis arising during pregnancy Hypokalemia  -->replete K orally/IV  LOS: 1 day   JACKSON-MOORE,Andersyn Fragoso A

## 2013-08-02 NOTE — H&P (Signed)
Chief Complaint   Patient presents with   .  Dehydration   .  Urinary Retention   .  Weight Loss    HPI  [name] is a 27 y.o. G1P0 at [redacted]w[redacted]d who presents to MAU today with N/V. The patient states that she was seen at [facility] recently for the same problem and given Zofran. She has been taking this, but it has not helped her symptoms. She has had very little if any food or liquid intake. She states that she has had headaches, dizziness, weight loss and decreased urine output. She denies fever or abdominal pain.  OB History    Grav  Para  Term  Preterm  Abortions  TAB  SAB  Ect  Mult  Living    2  1  1        1       Past Medical History   Diagnosis  Date   .  Pregnant    .  Trichomonas     Past Surgical History   Procedure  Laterality  Date   .  Wisdom tooth extraction      History reviewed. No pertinent family history.  History   Substance Use Topics   .  Smoking status:  Never Smoker   .  Smokeless tobacco:  Never Used   .  Alcohol Use:  Yes      Comment: Social prior to preg.    Allergies: No Known Allergies  Prescriptions prior to admission   Medication  Sig  Dispense  Refill   .  ondansetron (ZOFRAN ODT) 4 MG disintegrating tablet  Take 1 tablet (4 mg total) by mouth every 8 (eight) hours as needed for nausea.  12 tablet  0   .  OVER THE COUNTER MEDICATION  Take 1 each by mouth 3 (three) times daily as needed (for nausea/vomiting. PREGGIE POPS.).      Review of Systems  Constitutional: Negative for fever and malaise/fatigue.  Gastrointestinal: Positive for nausea and vomiting. Negative for abdominal pain, diarrhea and constipation.  Neurological: Positive for dizziness and weakness. Negative for loss of consciousness.   Physical Exam   Blood pressure 121/86, pulse 130, temperature 98.5 F (36.9 C), resp. rate 20, height 5\' 2"  (1.575 m), weight 133 lb 2 oz (60.385 kg), last menstrual period 06/05/2013.  Physical Exam  Constitutional: She is oriented to person,  place, and time. She appears well-developed and well-nourished. No distress.  HENT:  Head: Normocephalic and atraumatic.  Cardiovascular: Normal rate, regular rhythm and normal heart sounds.  Respiratory: Effort normal and breath sounds normal. No respiratory distress.  GI: Soft. She exhibits no distension and no mass. Bowel sounds are decreased. There is no tenderness. There is no rebound and no guarding.  Neurological: She is alert and oriented to person, place, and time.  Skin: Skin is warm and dry. No erythema.  Psychiatric: She has a normal mood and affect.   Results for orders placed during the hospital encounter of 08/01/13 (from the past 24 hour(s))   URINALYSIS, ROUTINE W REFLEX MICROSCOPIC Status: Abnormal    Collection Time    08/01/13 2:57 PM   Result  Value  Range    Color, Urine  YELLOW  YELLOW    APPearance  HAZY (*)  CLEAR    Specific Gravity, Urine  >1.030 (*)  1.005 - 1.030    pH  6.0  5.0 - 8.0    Glucose, UA  NEGATIVE  NEGATIVE mg/dL  Hgb urine dipstick  TRACE (*)  NEGATIVE    Bilirubin Urine  MODERATE (*)  NEGATIVE    Ketones, ur  15 (*)  NEGATIVE mg/dL    Protein, ur  409 (*)  NEGATIVE mg/dL    Urobilinogen, UA  1.0  0.0 - 1.0 mg/dL    Nitrite  NEGATIVE  NEGATIVE    Leukocytes, UA  NEGATIVE  NEGATIVE   URINE MICROSCOPIC-ADD ON Status: Abnormal    Collection Time    08/01/13 2:57 PM   Result  Value  Range    Squamous Epithelial / LPF  MANY (*)  RARE    WBC, UA  3-6  <3 WBC/hpf    RBC / HPF  0-2  <3 RBC/hpf    Bacteria, UA  MANY (*)  RARE    Urine-Other  MUCOUS PRESENT      Assessment and Plan   A:  Hyperemesis  P:  Admit to Women's Unit for further management

## 2013-08-03 DIAGNOSIS — E43 Unspecified severe protein-calorie malnutrition: Secondary | ICD-10-CM | POA: Insufficient documentation

## 2013-08-03 MED ORDER — DEXTROSE IN LACTATED RINGERS 5 % IV SOLN
INTRAVENOUS | Status: DC
  Administered 2013-08-04: 04:00:00 via INTRAVENOUS

## 2013-08-03 MED ORDER — ONDANSETRON 4 MG PO TBDP
4.0000 mg | ORAL_TABLET | Freq: Three times a day (TID) | ORAL | Status: DC | PRN
  Filled 2013-08-03: qty 1

## 2013-08-03 MED ORDER — POTASSIUM CHLORIDE 10 MEQ/100ML IV SOLN
10.0000 meq | INTRAVENOUS | Status: AC
  Administered 2013-08-03 (×4): 10 meq via INTRAVENOUS
  Filled 2013-08-03 (×4): qty 100

## 2013-08-03 MED ORDER — AMOXICILLIN 500 MG PO CAPS
500.0000 mg | ORAL_CAPSULE | Freq: Three times a day (TID) | ORAL | Status: DC
  Administered 2013-08-03 – 2013-08-05 (×5): 500 mg via ORAL
  Filled 2013-08-03 (×7): qty 1

## 2013-08-03 MED ORDER — AMOXICILLIN 500 MG PO CAPS: 500.0000 mg | ORAL_CAPSULE | Freq: Three times a day (TID) | ORAL | Status: DC

## 2013-08-03 MED ORDER — POTASSIUM CHLORIDE CRYS ER 20 MEQ PO TBCR
40.0000 meq | EXTENDED_RELEASE_TABLET | Freq: Three times a day (TID) | ORAL | Status: DC
  Filled 2013-08-03 (×2): qty 2

## 2013-08-03 MED ORDER — POTASSIUM CHLORIDE 10 MEQ/100ML IV SOLN
10.0000 meq | INTRAVENOUS | Status: AC
  Filled 2013-08-03 (×4): qty 100

## 2013-08-03 MED ORDER — ONDANSETRON HCL 4 MG/2ML IJ SOLN
4.0000 mg | Freq: Four times a day (QID) | INTRAMUSCULAR | Status: DC | PRN
  Administered 2013-08-03: 4 mg via INTRAVENOUS
  Filled 2013-08-03: qty 2

## 2013-08-03 NOTE — Discharge Summary (Signed)
Physician Discharge Summary  Patient ID: Colleen Schmidt MRN: [id number] DOB/AGE: 09/02/1986 27 y.o.  Admit date: 08/01/2013 Discharge date: 08/03/2013  Admission Diagnoses: Hyperemesis Gravidarum  Discharge Diagnoses: Same Active Problems:   Hyperemesis arising during pregnancy   Protein-calorie malnutrition, severe   Discharged Condition: good  Hospital Course: Admitted with refractory N/V and metabolic changes.  Responded well to antiemetic Rx.  Discharged home tolerating regular diet.  Consults: Dietary  Significant Diagnostic Studies: labs: CMET  Treatments: IV hydration and antiemetics  Discharge Exam: Blood pressure 97/61, pulse 84, temperature 98.3 F (36.8 C), temperature source Oral, resp. rate 17, height 5\' 2"  (1.575 m), weight 139 lb (63.05 kg), last menstrual period 06/05/2013, SpO2 99.00%. General appearance: alert and no distress GI: normal findings: soft, non-tender  Disposition: 01-Home or Self Care  Discharge Orders   Future Appointments Provider Department Dept Phone   08/09/2013 3:00 PM Antionette Char, MD [facility] CENTER [phone]   Future Orders Complete By Expires     Discharge instructions  As directed         Medication List         ondansetron 4 MG disintegrating tablet  Commonly known as:  ZOFRAN ODT  Take 1 tablet (4 mg total) by mouth every 8 (eight) hours as needed for nausea.     OVER THE COUNTER MEDICATION  Take 1 each by mouth 3 (three) times daily as needed (for nausea/vomiting.  PREGGIE POPS.).           Follow-up Information   Follow up with Antionette Char A, MD. Schedule an appointment as soon as possible for a visit in 1 week.   Contact information:   [address] [place] [address] [phone]       Signed: Brock Bad 08/03/2013, 10:02 AM

## 2013-08-03 NOTE — Progress Notes (Signed)
Notified md pt has vomited x 2 today. Orders to restart zofran.

## 2013-08-03 NOTE — Progress Notes (Signed)
Patient ID: Colleen Schmidt, female   DOB: Jun 29, 1986, 27 y.o.   MRN: [id number] Hospital Day: 3  S: Less nausea.  O: Blood pressure 93/46, pulse 78, temperature 98.2 F (36.8 C), temperature source Oral, resp. rate 16, height 5\' 2"  (1.575 m), weight 136 lb (61.689 kg), last menstrual period 06/05/2013, SpO2 100.00%.   FHT:not obtained Toco: None SVE:   A/P- 27 y.o. admitted with:  N/V.  Stable.  Continue antiemetic therapy.  Present on Admission:  . Hyperemesis arising during pregnancy  Pregnancy Complications: N/V  Preterm labor management: no treatment necessary Dating:  [redacted]w[redacted]d PNL Needed:  CMET FWB:  good PTL:  none

## 2013-08-04 LAB — URINE CULTURE: Colony Count: >=100000

## 2013-08-04 LAB — BASIC METABOLIC PANEL
BUN: 4 mg/dL — ABNORMAL LOW (ref 6–23)
CO2: 27 mEq/L (ref 19–32)
Calcium: 8.6 mg/dL (ref 8.4–10.5)
Creatinine, Ser: 0.61 mg/dL (ref 0.50–1.10)
GFR calc non Af Amer: >90 mL/min (ref >90)
Glucose, Bld: 99 mg/dL (ref 70–99)
Sodium: 136 mEq/L (ref 135–145)

## 2013-08-04 MED ORDER — POTASSIUM CHLORIDE 10 MEQ/100ML IV SOLN
10.0000 meq | INTRAVENOUS | Status: AC
  Administered 2013-08-04 (×4): 10 meq via INTRAVENOUS
  Filled 2013-08-04 (×4): qty 100

## 2013-08-04 NOTE — Progress Notes (Signed)
Patient ID: Colleen Schmidt, female   DOB: [birth year], 27 y.o.   MRN: [id number] Hospital Day: 4  S: Less nausea and no vomiting overnight.  O: Blood pressure 92/54, pulse 86, temperature 98.1 F (36.7 C), temperature source Oral, resp. rate 18, height 5\' 2"  (1.575 m), weight 138 lb 8 oz (62.823 kg), last menstrual period 06/05/2013, SpO2 99.00%.   VHQ:IONGEXBM: 150 bpm Toco: None SVE:   A/P- 27 y.o. admitted with:  Present on Admission:  . Hyperemesis arising during pregnancy  Pregnancy Complications: N/V  Preterm labor management: no treatment necessary Dating:  [redacted]w[redacted]d PNL Needed:  K+ FWB:  goog PTL:  stable

## 2013-08-05 LAB — COMPREHENSIVE METABOLIC PANEL
Albumin: 2.6 g/dL — ABNORMAL LOW (ref 3.5–5.2)
Alkaline Phosphatase: 46 U/L (ref 39–117)
BUN: 5 mg/dL — ABNORMAL LOW (ref 6–23)
Calcium: 8.6 mg/dL (ref 8.4–10.5)
Creatinine, Ser: 0.62 mg/dL (ref 0.50–1.10)
GFR calc Af Amer: >90 mL/min (ref >90)
Glucose, Bld: 86 mg/dL (ref 70–99)
Potassium: 4.3 mEq/L (ref 3.5–5.1)
Total Protein: 5.4 g/dL — ABNORMAL LOW (ref 6.0–8.3)

## 2013-08-05 MED ORDER — ONDANSETRON 4 MG PO TBDP: 4.0000 mg | ORAL_TABLET | Freq: Three times a day (TID) | ORAL | Status: DC | PRN

## 2013-08-05 NOTE — Progress Notes (Addendum)
Discharge instructions provided to patient at bedside.  Medications, activity, follow up appointments, when to call the doctor and community resources discussed.  No questions at this time.  Patient left unit in stable condition with all personal belongings.  K. Kania Regnier, RN-----------  

## 2013-08-05 NOTE — Discharge Summary (Signed)
Physician Discharge Summary  Patient ID: Colleen Schmidt MRN: [id number] DOB/AGE: 1986/09/28 27 y.o.  Admit date: 08/01/2013 Discharge date: 08/05/2013  Admission Diagnoses:  Hyperemesis Gravidarum, Hypokalemia  Discharge Diagnoses:  Same, Improved Active Problems:   Hyperemesis arising during pregnancy   Protein-calorie malnutrition, severe   Discharged Condition: good  Hospital Course: Admitted with N/V, dehydration and hypokalemia.  Consults: Dietary  Significant Diagnostic Studies: labs: CMET  Treatments: IV hydration and antiemetics.  Discharge Exam: Blood pressure 84/60, pulse 91, temperature 98.2 F (36.8 C), temperature source Oral, resp. rate 20, height 5\' 2"  (1.575 m), weight 142 lb 8 oz (64.638 kg), last menstrual period 06/05/2013, SpO2 99.00%. General appearance: alert and no distress GI: normal findings: soft, non-tender Extremities: extremities normal, atraumatic, no cyanosis or edema  Disposition: 01-Home or Self Care  Discharge Orders   Future Appointments Provider Department Dept Phone   08/09/2013 3:00 PM Antionette Char, MD [facility] CENTER [phone]   Future Orders Complete By Expires     Discharge activity:  No Restrictions  As directed     Discharge diet:  As directed     Discharge instructions  As directed     Discharge instructions  As directed     Comments:      Routine    No sexual activity restrictions  As directed         Medication List         amoxicillin 500 MG capsule  Commonly known as:  AMOXIL  Take 1 capsule (500 mg total) by mouth 3 (three) times daily.     ondansetron 4 MG disintegrating tablet  Commonly known as:  ZOFRAN ODT  Take 1 tablet (4 mg total) by mouth every 8 (eight) hours as needed for nausea.     ondansetron 4 MG disintegrating tablet  Commonly known as:  ZOFRAN-ODT  Take 1 tablet (4 mg total) by mouth every 8 (eight) hours as needed.     OVER THE COUNTER MEDICATION  Take 1 each by mouth 3  (three) times daily as needed (for nausea/vomiting.  PREGGIE POPS.).           Follow-up Information   Follow up with Antionette Char A, MD. Schedule an appointment as soon as possible for a visit in 1 week.   Contact information:   [address] [place] [address] [phone]       Signed: Brock Bad 08/05/2013, 8:48 AM

## 2013-08-05 NOTE — Progress Notes (Signed)
Patient ID: Colleen Schmidt, female   DOB: 1986/05/09, 27 y.o.   MRN: [id number] Hospital Day: 5  S: Less nausea.  No vomiting x 24 hours.  Tolerating diet.  O: Blood pressure 84/60, pulse 91, temperature 98.2 F (36.8 C), temperature source Oral, resp. rate 20, height 5\' 2"  (1.575 m), weight 142 lb 8 oz (64.638 kg), last menstrual period 06/05/2013, SpO2 99.00%.   FHT:  Not done Toco: None SVE:   A/P- 27 y.o. admitted with:  Hyperemesis.  Improved.  Discharge home.  Present on Admission:  . Hyperemesis arising during pregnancy  Pregnancy Complications: N/V  Preterm labor management: no treatment necessary Dating:  [redacted]w[redacted]d PNL Needed:  K+ FWB:  good PTL:  none

## 2013-08-09 ENCOUNTER — Ambulatory Visit (INDEPENDENT_AMBULATORY_CARE_PROVIDER_SITE_OTHER): Payer: Medicaid Other | Admitting: Obstetrics & Gynecology

## 2013-08-09 ENCOUNTER — Encounter: Payer: Self-pay | Admitting: Obstetrics & Gynecology

## 2013-08-09 VITALS — BP 111/80 | Temp 98.5°F | Wt 144.0 lb

## 2013-08-09 DIAGNOSIS — B373 Candidiasis of vulva and vagina: Secondary | ICD-10-CM

## 2013-08-09 DIAGNOSIS — Z3481 Encounter for supervision of other normal pregnancy, first trimester: Secondary | ICD-10-CM

## 2013-08-09 DIAGNOSIS — Z3480 Encounter for supervision of other normal pregnancy, unspecified trimester: Secondary | ICD-10-CM

## 2013-08-09 LAB — POCT URINALYSIS DIPSTICK
Blood, UA: NEGATIVE
Ketones, UA: NEGATIVE
Protein, UA: NEGATIVE
Spec Grav, UA: 1.02
Urobilinogen, UA: NEGATIVE
pH, UA: 6

## 2013-08-09 MED ORDER — FLUCONAZOLE 150 MG PO TABS: 150.0000 mg | ORAL_TABLET | Freq: Once | ORAL | Status: DC

## 2013-08-09 NOTE — Addendum Note (Signed)
Addended by: Julaine Hua on: 08/09/2013 05:42 PM   Modules accepted: Orders

## 2013-08-09 NOTE — Progress Notes (Signed)
Pulse- 69.  Patient is currently taking an antibiotic for a UTI - she now has an abnormal discharge with vaginal itching. . Subjective:    Colleen Schmidt is being seen today for her first obstetrical visit.  This is not a planned pregnancy. She is at [redacted]w[redacted]d gestation. Her obstetrical history is significant for Hyperemesis. Relationship with FOB: significant other, living together. Patient does intend to breast feed. Pregnancy history fully reviewed.  Menstrual History: OB History   Grav Para Term Preterm Abortions TAB SAB Ect Mult Living   2 1 1       1       Menarche age: 89 Patient's last menstrual period was 06/05/2013.    The following portions of the patient's history were reviewed and updated as appropriate: allergies, current medications, past family history, past medical history, past social history, past surgical history and problem list.  Review of Systems Pertinent items are noted in HPI.    Objective:    General appearance: alert and no distress Abdomen: normal findings: soft, non-tender Pelvic: cervix normal in appearance, external genitalia normal, no adnexal masses or tenderness, no cervical motion tenderness, rectovaginal septum normal, vagina normal without discharge and uterus slightly enlarged, soft and NT. Extremities: extremities normal, atraumatic, no cyanosis or edema    Assessment:    Pregnancy at [redacted]w[redacted]d weeks    Plan:    Initial labs drawn. Prenatal vitamins.  Counseling provided regarding continued use of seat belts, cessation of alcohol consumption, smoking or use of illicit drugs; infection precautions i.e., influenza/TDAP immunizations, toxoplasmosis,CMV, parvovirus, listeria and varicella; workplace safety, exercise during pregnancy; routine dental care, safe medications, sexual activity, hot tubs, saunas, pools, travel, caffeine use, fish and methlymercury, potential toxins, hair treatments, varicose veins Weight gain recommendations reviewed:  underweight/BMI< 18.5--> gain 28 - 40 lbs; normal weight/BMI 18.5 - 24.9--> gain 25 - 35 lbs; overweight/BMI 25 - 29.9--> gain 15 - 25 lbs; obese/BMI >30->gain  11 - 20 lbs Problem list reviewed and updated. AFP3 discussed: requested. Role of ultrasound in pregnancy discussed; fetal survey: requested. Amniocentesis discussed: not indicated. Follow up in 4 weeks. 50% of 20 min visit spent on counseling and coordination of care.

## 2013-08-10 LAB — WET PREP BY MOLECULAR PROBE
Candida species: NEGATIVE
Gardnerella vaginalis: NEGATIVE
Trichomonas vaginosis: NEGATIVE

## 2013-08-10 LAB — GC/CHLAMYDIA PROBE AMP
CT Probe RNA: NEGATIVE
GC Probe RNA: NEGATIVE

## 2013-08-14 LAB — PAP IG W/ RFLX HPV ASCU

## 2013-08-16 ENCOUNTER — Inpatient Hospital Stay (HOSPITAL_COMMUNITY)
Admission: AD | Admit: 2013-08-16 | Discharge: 2013-08-20 | DRG: 781 | Disposition: A | Discharge status: Home / Self Care | Payer: Medicaid Other | Source: Ambulatory Visit | Attending: Obstetrics & Gynecology | Admitting: Obstetrics & Gynecology

## 2013-08-16 ENCOUNTER — Encounter (HOSPITAL_COMMUNITY): Payer: Self-pay | Admitting: *Deleted

## 2013-08-16 DIAGNOSIS — O210 Mild hyperemesis gravidarum: Secondary | ICD-10-CM

## 2013-08-16 DIAGNOSIS — E46 Unspecified protein-calorie malnutrition: Secondary | ICD-10-CM | POA: Diagnosis present

## 2013-08-16 DIAGNOSIS — R634 Abnormal weight loss: Secondary | ICD-10-CM | POA: Diagnosis present

## 2013-08-16 DIAGNOSIS — O99891 Other specified diseases and conditions complicating pregnancy: Secondary | ICD-10-CM | POA: Diagnosis present

## 2013-08-16 DIAGNOSIS — E876 Hypokalemia: Secondary | ICD-10-CM | POA: Diagnosis present

## 2013-08-16 LAB — URINALYSIS, ROUTINE W REFLEX MICROSCOPIC
Glucose, UA: NEGATIVE mg/dL
Hgb urine dipstick: NEGATIVE
Ketones, ur: 40 mg/dL — AB
Protein, ur: 30 mg/dL — AB

## 2013-08-16 LAB — COMPREHENSIVE METABOLIC PANEL
AST: 14 U/L (ref 0–37)
CO2: 26 mEq/L (ref 19–32)
Calcium: 10.3 mg/dL (ref 8.4–10.5)
Creatinine, Ser: 0.7 mg/dL (ref 0.50–1.10)
GFR calc Af Amer: >90 mL/min (ref >90)
GFR calc non Af Amer: >90 mL/min (ref >90)
Glucose, Bld: 105 mg/dL — ABNORMAL HIGH (ref 70–99)

## 2013-08-16 LAB — CBC WITH DIFFERENTIAL/PLATELET
Basophils Absolute: 0 10*3/uL (ref 0.0–0.1)
Eosinophils Relative: 0 % (ref 0–5)
HCT: 40.7 % (ref 36.0–46.0)
Lymphocytes Relative: 18 % (ref 12–46)
MCH: 30.3 pg (ref 26.0–34.0)
MCV: 84.4 fL (ref 78.0–100.0)
Monocytes Absolute: 0.6 10*3/uL (ref 0.1–1.0)
RDW: 12.2 % (ref 11.5–15.5)
WBC: 12.8 10*3/uL — ABNORMAL HIGH (ref 4.0–10.5)

## 2013-08-16 LAB — URINE MICROSCOPIC-ADD ON

## 2013-08-16 MED ORDER — PROMETHAZINE HCL 25 MG/ML IJ SOLN: 25.0000 mg | Freq: Four times a day (QID) | INTRAMUSCULAR | Status: DC | PRN

## 2013-08-16 MED ORDER — POTASSIUM CHLORIDE 2 MEQ/ML IV SOLN
Freq: Once | INTRAVENOUS | Status: AC
  Administered 2013-08-16: 22:00:00 via INTRAVENOUS
  Filled 2013-08-16: qty 1000

## 2013-08-16 MED ORDER — ONDANSETRON HCL 4 MG/2ML IJ SOLN
4.0000 mg | INTRAMUSCULAR | Status: DC | PRN
  Administered 2013-08-16 – 2013-08-18 (×2): 4 mg via INTRAVENOUS
  Filled 2013-08-16 (×2): qty 2

## 2013-08-16 MED ORDER — PROMETHAZINE HCL 25 MG/ML IJ SOLN
25.0000 mg | Freq: Once | INTRAMUSCULAR | Status: AC
  Administered 2013-08-16: 25 mg via INTRAVENOUS
  Filled 2013-08-16: qty 1

## 2013-08-16 MED ORDER — POTASSIUM CHLORIDE 2 MEQ/ML IV SOLN
INTRAVENOUS | Status: DC
  Administered 2013-08-17 – 2013-08-19 (×5): via INTRAVENOUS
  Filled 2013-08-16 (×8): qty 1000

## 2013-08-16 NOTE — MAU Provider Note (Signed)
History     CSN: [id number]  Arrival date and time: 08/16/13 1805   None     Chief Complaint  Patient presents with  . Emesis During Pregnancy   HPI Colleen Schmidt is 27 y.o. G2P1001 [redacted]w[redacted]d weeks presenting with nausea and vomiting.  She is a patient of Dr. Marcia Brash.  Dr. Gaynell Face is on call tonight.  She has been hospitalized early August for hyperemesis with this pregnancy. States she has lost 9 pounds since discharge. SHe also had very low K+ during that hospitalization, down to 2.1.   She had Zofran that is no longer controlling her sxs. She is actively vomiting and has filled 4 emesis basins while in MAU.      Past Medical History  Diagnosis Date  . Pregnant   . Trichomonas     Past Surgical History  Procedure Laterality Date  . Wisdom tooth extraction      Family History  Problem Relation Age of Onset  . Hypertension Mother   . Cancer Maternal Grandmother     lung  . Hypertension Paternal Grandmother   . Cancer Paternal Grandfather     History  Substance Use Topics  . Smoking status: Never Smoker   . Smokeless tobacco: Never Used  . Alcohol Use: Yes     Comment: Social prior to preg.    Allergies: No Known Allergies  Prescriptions prior to admission  Medication Sig Dispense Refill  . amoxicillin (AMOXIL) 500 MG capsule Take 1 capsule (500 mg total) by mouth 3 (three) times daily.  21 capsule  0  . fluconazole (DIFLUCAN) 150 MG tablet Take 1 tablet (150 mg total) by mouth once.  1 tablet  2  . ondansetron (ZOFRAN ODT) 4 MG disintegrating tablet Take 1 tablet (4 mg total) by mouth every 8 (eight) hours as needed for nausea.  12 tablet  0  . ondansetron (ZOFRAN-ODT) 4 MG disintegrating tablet Take 1 tablet (4 mg total) by mouth every 8 (eight) hours as needed.  30 tablet  4  . OVER THE COUNTER MEDICATION Take 1 each by mouth 3 (three) times daily as needed (for nausea/vomiting.  PREGGIE POPS.).        Review of Systems  Constitutional: Negative  for weight loss.  Gastrointestinal: Negative for nausea and vomiting.   Physical Exam   Blood pressure 126/93, pulse 120, resp. rate 16, height 5\' 2"  (1.575 m), weight 134 lb 3.2 oz (60.873 kg), last menstrual period 06/05/2013.  Physical Exam  Constitutional: She is oriented to person, place, and time. She appears well-developed.  Actively vomiting  HENT:  Head: Normocephalic.  Neurological: She is alert and oriented to person, place, and time.  Skin: Skin is warm and dry.  Psychiatric: She has a normal mood and affect. Her behavior is normal.    Results for orders placed during the hospital encounter of 08/16/13 (from the past 24 hour(s))  URINALYSIS, ROUTINE W REFLEX MICROSCOPIC     Status: Abnormal   Collection Time    08/16/13  6:55 PM      Result Value Range   Color, Urine AMBER (*) YELLOW   APPearance CLEAR  CLEAR   Specific Gravity, Urine >1.030 (*) 1.005 - 1.030   pH 6.0  5.0 - 8.0   Glucose, UA NEGATIVE  NEGATIVE mg/dL   Hgb urine dipstick NEGATIVE  NEGATIVE   Bilirubin Urine SMALL (*) NEGATIVE   Ketones, ur 40 (*) NEGATIVE mg/dL   Protein, ur 30 (*) NEGATIVE mg/dL  Urobilinogen, UA 1.0  0.0 - 1.0 mg/dL   Nitrite NEGATIVE  NEGATIVE   Leukocytes, UA NEGATIVE  NEGATIVE  URINE MICROSCOPIC-ADD ON     Status: Abnormal   Collection Time    08/16/13  6:55 PM      Result Value Range   Squamous Epithelial / LPF FEW (*) RARE   WBC, UA 3-6  <3 WBC/hpf   RBC / HPF 0-2  <3 RBC/hpf   Bacteria, UA MANY (*) RARE   Urine-Other MUCOUS PRESENT    CBC WITH DIFFERENTIAL     Status: Abnormal   Collection Time    08/16/13  8:00 PM      Result Value Range   WBC 12.8 (*) 4.0 - 10.5 K/uL   RBC 4.82  3.87 - 5.11 MIL/uL   Hemoglobin 14.6  12.0 - 15.0 g/dL   HCT 16.1  09.6 - 04.5 %   MCV 84.4  78.0 - 100.0 fL   MCH 30.3  26.0 - 34.0 pg   MCHC 35.9  30.0 - 36.0 g/dL   RDW 40.9  81.1 - 91.4 %   Platelets 357  150 - 400 K/uL   Neutrophils Relative % 77  43 - 77 %   Neutro Abs 9.9  (*) 1.7 - 7.7 K/uL   Lymphocytes Relative 18  12 - 46 %   Lymphs Abs 2.3  0.7 - 4.0 K/uL   Monocytes Relative 5  3 - 12 %   Monocytes Absolute 0.6  0.1 - 1.0 K/uL   Eosinophils Relative 0  0 - 5 %   Eosinophils Absolute 0.0  0.0 - 0.7 K/uL   Basophils Relative 0  0 - 1 %   Basophils Absolute 0.0  0.0 - 0.1 K/uL  COMPREHENSIVE METABOLIC PANEL     Status: Abnormal   Collection Time    08/16/13  8:00 PM      Result Value Range   Sodium 139  135 - 145 mEq/L   Potassium 3.2 (*) 3.5 - 5.1 mEq/L   Chloride 96  96 - 112 mEq/L   CO2 26  19 - 32 mEq/L   Glucose, Bld 105 (*) 70 - 99 mg/dL   BUN 9  6 - 23 mg/dL   Creatinine, Ser 7.82  0.50 - 1.10 mg/dL   Calcium 95.6  8.4 - 21.3 mg/dL   Total Protein 8.5 (*) 6.0 - 8.3 g/dL   Albumin 4.5  3.5 - 5.2 g/dL   AST 14  0 - 37 U/L   ALT 9  0 - 35 U/L   Alkaline Phosphatase 86  39 - 117 U/L   Total Bilirubin 0.4  0.3 - 1.2 mg/dL   GFR calc non Af Amer >90  >90 mL/min   GFR calc Af Amer >90  >90 mL/min    MAU Course  Procedures  MDM 1 liter of D5LR with Phenergan 25mg  IV ordered.   20:50 Reported patient's HPI, labs to Dr. Gaynell Face.  Order given to admit for IV hydration and K+ replacement.  Orders included IV hydration with LR at 125cc/hr.  Next liter add multivits.  Add 40 meq of K+ Cl to each liter.    Assessment and Plan  A:  Hyperemesis at [redacted]w[redacted]d gestation      Low Potassium     Weigh loss  P:  Admit for hydration  KEY,EVE M 08/16/2013, 7:01 PM

## 2013-08-16 NOTE — MAU Note (Signed)
Patient states she had been admitted for hyperemesis. States she has continued to have vomiting and medication is no longer working. Has been unable to keep anything down. Patient is actively vomiting in triage. Denies pain or bleeding but does continue to have a vaginal discharge.

## 2013-08-17 LAB — URINE CULTURE

## 2013-08-17 MED ORDER — METHYLPREDNISOLONE 4 MG PO TABS: 8.0000 mg | ORAL_TABLET | Freq: Every day | ORAL | Status: DC

## 2013-08-17 MED ORDER — METHYLPREDNISOLONE 16 MG PO TABS
16.0000 mg | ORAL_TABLET | Freq: Every day | ORAL | Status: DC
  Administered 2013-08-18 – 2013-08-19 (×2): 16 mg via ORAL
  Filled 2013-08-17 (×2): qty 1

## 2013-08-17 MED ORDER — METHYLPREDNISOLONE 4 MG PO TABS: 4.0000 mg | ORAL_TABLET | Freq: Every day | ORAL | Status: DC

## 2013-08-17 MED ORDER — POTASSIUM CHLORIDE 10 MEQ/100ML IV SOLN
10.0000 meq | INTRAVENOUS | Status: AC
  Administered 2013-08-17 (×4): 10 meq via INTRAVENOUS
  Filled 2013-08-17 (×4): qty 100

## 2013-08-17 MED ORDER — METHYLPREDNISOLONE 16 MG PO TABS
16.0000 mg | ORAL_TABLET | Freq: Every day | ORAL | Status: DC
  Administered 2013-08-18 – 2013-08-20 (×3): 16 mg via ORAL
  Filled 2013-08-17 (×3): qty 1

## 2013-08-17 MED ORDER — METHYLPREDNISOLONE 16 MG PO TABS
16.0000 mg | ORAL_TABLET | Freq: Every day | ORAL | Status: AC
  Administered 2013-08-18 – 2013-08-19 (×2): 16 mg via ORAL
  Filled 2013-08-17 (×2): qty 1

## 2013-08-17 MED ORDER — METHYLPREDNISOLONE SODIUM SUCC 125 MG IJ SOLR
48.0000 mg | Freq: Once | INTRAMUSCULAR | Status: AC
  Administered 2013-08-17: 48 mg via INTRAVENOUS
  Filled 2013-08-17: qty 0.77

## 2013-08-17 MED ORDER — METHYLPREDNISOLONE 4 MG PO TABS
8.0000 mg | ORAL_TABLET | Freq: Every day | ORAL | Status: DC
  Filled 2013-08-17: qty 2

## 2013-08-17 NOTE — Progress Notes (Signed)
INITIAL NUTRITION ASSESSMENT  DOCUMENTATION CODES Per approved criteria  -Severe malnutrition in the context of acute illness or injury   INTERVENTION:  Advance to solid diet as tolerated  Snacks TID between meals.  Consider option of steroid taper if antiemetics ineffective  Enteral support  Is an option if Hyperemesis persisits   NUTRITION DIAGNOSIS: Malnutrition related to altered GI function as evidenced by 9.5 % weight loss in the past 6 weeks and intake </= 50% of estimated energy requirement for >/= 5 days.   Goal: Intake to meet >90% of estimated nutrition needs.  Monitor:  PO intake, weight trend.  Reason for Assessment: Nutrition Risk/weight loss   27 y.o. female  Admitting Dx: Hyperemesis  ASSESSMENT: Patient reports nausea and vomiting for the past 6 weeks. Unable to keep much food down. Reports that she has lost 8 lb since last admission. No period of tolerating food after last admission. Was ordering C/L when in room. Height: Ht Readings from Last 1 Encounters:  08/16/13 5\' 2"  (1.575 m)    Weight: Wt Readings from Last 1 Encounters:  08/17/13 136 lb (61.689 kg)    Ideal Body Weight: 50 kg  % Ideal Body Weight: 123%  Wt Readings from Last 10 Encounters:  08/17/13 136 lb (61.689 kg)  08/09/13 144 lb (65.318 kg)  08/05/13 142 lb 8 oz (64.638 kg)  07/08/13 150 lb (68.04 kg)    Usual Body Weight: 150 lb  % Usual Body Weight: 90.5%  BMI:  Body mass index is 24.87 kg/(m^2).  Estimated Nutritional Needs: Kcal: 1700-1900 Protein: 70-85 gm Fluid: 1.7-1.9 L  Skin: no problems  Diet Order: Clear Liquid  EDUCATION NEEDS: -Education needs addressed- handout on morning sickness provided last admission   Intake/Output Summary (Last 24 hours) at 08/17/13 1354 Last data filed at 08/17/13 0746  Gross per 24 hour  Intake 956.25 ml  Output    225 ml  Net 731.25 ml    Labs:   Recent Labs Lab 08/16/13 2000  NA 139  K 3.2*  CL 96  CO2  26  BUN 9  CREATININE 0.70  CALCIUM 10.3  GLUCOSE 105*    CBG (last 3)  No results found for this basename: GLUCAP,  in the last 72 hours  Scheduled Meds:    Continuous Infusions: . lactated ringers 1,000 mL with potassium chloride 40 mEq infusion 125 mL/hr at 08/17/13 8119    Past Medical History  Diagnosis Date  . Pregnant   . Trichomonas     Past Surgical History  Procedure Laterality Date  . Wisdom tooth extraction       Elisabeth Cara M.Odis Luster LDN Neonatal Nutrition Support Specialist Pager [phone]

## 2013-08-17 NOTE — Progress Notes (Signed)
Patient ID: BRYLIN STOPPER, female   DOB: [birth year], 27 y.o.   MRN: [id number]  S: Patient tolerated crackers this am. Reports feeling better with IV fluids running. Reports her nausea and vomiting started once she left the hospital last time. Denies feeling this way with her last pregnancy.  O: Filed Vitals:   08/17/13 1200  BP: 98/62  Pulse: 77  Temp: 98.4 F (36.9 C)  Resp: 16   FHR 168  General appearance: alert and cooperative Abdomen: soft, non-tender; bowel sounds normal; no masses,  no organomegaly Neurologic: Alert and oriented X 3, normal strength and tone. Normal symmetric reflexes. Normal coordination and gait  A:  IUP @ 10 3/7 weeks by LMP consistent w/ 8 week [place] Hyperemesis of pregnancy with weight loss Malnutrition Hypokalemia  P: Potassium runs Phenergan in IV fluids Will start Methylprednisolone for management Reevaluate once patient tolerating PO foods CMP in am  Zaidin Blyden Wilson Singer CNM

## 2013-08-18 LAB — COMPREHENSIVE METABOLIC PANEL
ALT: 5 U/L (ref 0–35)
AST: 12 U/L (ref 0–37)
Albumin: 3 g/dL — ABNORMAL LOW (ref 3.5–5.2)
CO2: 19 mEq/L (ref 19–32)
Calcium: 9 mg/dL (ref 8.4–10.5)
Chloride: 103 mEq/L (ref 96–112)
Creatinine, Ser: 0.5 mg/dL (ref 0.50–1.10)
GFR calc non Af Amer: >90 mL/min (ref >90)
Sodium: 133 mEq/L — ABNORMAL LOW (ref 135–145)
Total Bilirubin: 0.3 mg/dL (ref 0.3–1.2)

## 2013-08-18 LAB — GLUCOSE, CAPILLARY

## 2013-08-18 MED ORDER — PANTOPRAZOLE SODIUM 40 MG IV SOLR
40.0000 mg | Freq: Two times a day (BID) | INTRAVENOUS | Status: DC
  Administered 2013-08-18 – 2013-08-20 (×5): 40 mg via INTRAVENOUS
  Filled 2013-08-18 (×5): qty 40

## 2013-08-18 MED ORDER — PROMETHAZINE HCL 25 MG/ML IJ SOLN: 12.5000 mg | Freq: Four times a day (QID) | INTRAMUSCULAR | Status: DC | PRN

## 2013-08-18 MED ORDER — POTASSIUM CHLORIDE 10 MEQ/100ML IV SOLN
10.0000 meq | INTRAVENOUS | Status: DC
  Filled 2013-08-18 (×4): qty 100

## 2013-08-18 NOTE — Progress Notes (Signed)
Patient ID: Colleen Schmidt, female   DOB: 03-02-1986, 27 y.o.   MRN: [id number] Hospital Day: 3  S: Less nausea.  O: Blood pressure 94/60, pulse 88, temperature 98.3 F (36.8 C), temperature source Oral, resp. rate 16, height 5\' 2"  (1.575 m), weight 138 lb 12 oz (62.937 kg), last menstrual period 06/05/2013, SpO2 98.00%.   WUJ:WJXBJYNW: 150 bpm Toco: None SVE:   A/P- 27 y.o. admitted with:  Severe Hyperemesis with metabolic changes.  Stable.  Continue antiemetic therapy.  Present on Admission:  **None**  Pregnancy Complications: Hyperemesis Gravidarum  Preterm labor management: no treatment necessary Dating:  [redacted]w[redacted]d PNL Needed:  K+ FWB:  good

## 2013-08-19 LAB — GLUCOSE, CAPILLARY: Glucose-Capillary: 109 mg/dL — ABNORMAL HIGH (ref 70–99)

## 2013-08-19 MED ORDER — LACTATED RINGERS IV SOLN
INTRAVENOUS | Status: DC
  Administered 2013-08-19 – 2013-08-20 (×3): via INTRAVENOUS

## 2013-08-19 MED ORDER — METOCLOPRAMIDE HCL 10 MG PO TABS
5.0000 mg | ORAL_TABLET | Freq: Three times a day (TID) | ORAL | Status: DC
  Administered 2013-08-19 – 2013-08-20 (×4): 5 mg via ORAL
  Filled 2013-08-19 (×4): qty 1

## 2013-08-19 NOTE — Progress Notes (Signed)
Patient ID: AL GAGEN, female   DOB: July 15, 1986, 27 y.o.   MRN: [id number] Hospital Day: 4  S: One episode of emesis over past 24 hours.  Tolerating diet.  O: Blood pressure 99/66, pulse 78, temperature 98.7 F (37.1 C), temperature source Oral, resp. rate 18, height 5\' 2"  (1.575 m), weight 139 lb 4 oz (63.163 kg), last menstrual period 06/05/2013, SpO2 98.00%.   WUJ:WJXBJYNW: 150-160 bpm Toco: None SVE:   A/P- 27 y.o. admitted with: Hyperemesis Gravidarum with metabolic changes.  Much improved.  Plan discharge home tomorrow.  Present on Admission:  **None**  Pregnancy Complications: Severe Hyperemesis with metabolic changes. Preterm labor management: no treatment necessary Dating:  [redacted]w[redacted]d PNL Needed:  none FWB:  good PTL:  none

## 2013-08-20 MED ORDER — METHYLPREDNISOLONE 4 MG PO TABS: 4.0000 mg | ORAL_TABLET | Freq: Every day | ORAL | Status: DC

## 2013-08-20 MED ORDER — METHYLPREDNISOLONE 8 MG PO TABS: 8.0000 mg | ORAL_TABLET | Freq: Every day | ORAL | Status: DC

## 2013-08-20 MED ORDER — OMEPRAZOLE 20 MG PO CPDR: 20.0000 mg | DELAYED_RELEASE_CAPSULE | Freq: Every day | ORAL | Status: DC

## 2013-08-20 MED ORDER — METOCLOPRAMIDE HCL 5 MG PO TABS: 5.0000 mg | ORAL_TABLET | Freq: Three times a day (TID) | ORAL | Status: DC

## 2013-08-20 MED ORDER — METHYLPREDNISOLONE 16 MG PO TABS: 16.0000 mg | ORAL_TABLET | Freq: Every day | ORAL | Status: DC

## 2013-08-20 NOTE — Discharge Summary (Signed)
Physician Discharge Summary  Patient ID: Colleen Schmidt MRN: [id number] DOB/AGE: [birth year] 27 y.o.  Admit date: 08/16/2013 Discharge date: 08/20/2013  Admission Diagnoses: Severe Hyperemesis Gravidarum  Discharge Diagnoses: Same Active Problems:   * No active hospital problems. *   Discharged Condition: good  Hospital Course: Admitted with intractable N/V and hypokalemia.  Responded well to antiemetic therapy.  Discharged home, much improved.  Consults: Dietary  Significant Diagnostic Studies: labs: CMET  Treatments: IV hydration, steroids: solu-medrol and zofran.  Discharge Exam: Blood pressure 107/67, pulse 60, temperature 98.6 F (37 C), temperature source Oral, resp. rate 18, height 5\' 2"  (1.575 m), weight 140 lb (63.504 kg), last menstrual period 06/05/2013, SpO2 100.00%. General appearance: alert and no distress GI: normal findings: soft, non-tender  Disposition: 01-Home or Self Care   Future Appointments Provider Department Dept Phone   09/05/2013 10:00 AM Antionette Char, MD [facility] CENTER [phone]       Medication List    STOP taking these medications       amoxicillin 500 MG capsule  Commonly known as:  AMOXIL     fluconazole 150 MG tablet  Commonly known as:  DIFLUCAN      TAKE these medications       methylPREDNISolone 16 MG tablet  Commonly known as:  MEDROL  Take 1 tablet (16 mg total) by mouth daily with breakfast.     methylPREDNISolone 16 MG tablet  Commonly known as:  MEDROL  Take 1 tablet (16 mg total) by mouth at bedtime.     methylPREDNISolone 8 MG tablet  Commonly known as:  MEDROL  Take 1 tablet (8 mg total) by mouth daily at 2 PM daily at 2 PM.     methylPREDNISolone 8 MG tablet  Commonly known as:  MEDROL  Take 1 tablet (8 mg total) by mouth at bedtime.  Start taking on:  08/21/2013     methylPREDNISolone 8 MG tablet  Commonly known as:  MEDROL  Take 1 tablet (8 mg total) by mouth daily with breakfast.  Start  taking on:  08/22/2013     methylPREDNISolone 4 MG tablet  Commonly known as:  MEDROL  Take 1 tablet (4 mg total) by mouth daily at 2 PM daily at 2 PM.  Start taking on:  08/23/2013     methylPREDNISolone 4 MG tablet  Commonly known as:  MEDROL  Take 1 tablet (4 mg total) by mouth at bedtime.  Start taking on:  08/24/2013     methylPREDNISolone 4 MG tablet  Commonly known as:  MEDROL  Take 1 tablet (4 mg total) by mouth daily with breakfast.  Start taking on:  08/29/2013     metoCLOPramide 5 MG tablet  Commonly known as:  REGLAN  Take 1 tablet (5 mg total) by mouth 4 (four) times daily -  before meals and at bedtime.     miconazole 2 % cream  Commonly known as:  MICOTIN  Apply 1 application topically daily as needed (For itching.).     omeprazole 20 MG capsule  Commonly known as:  PRILOSEC  Take 1 capsule (20 mg total) by mouth daily.     ondansetron 4 MG disintegrating tablet  Commonly known as:  ZOFRAN-ODT  Take 1 tablet (4 mg total) by mouth every 8 (eight) hours as needed.     OVER THE COUNTER MEDICATION  Take 1 each by mouth 3 (three) times daily as needed (for nausea/vomiting.  PREGGIE POPS.).     prenatal multivitamin  Tabs tablet  Take 1 tablet by mouth daily at 12 noon.           Follow-up Information   Follow up with Antionette Char A, MD. Schedule an appointment as soon as possible for a visit in 2 weeks.   Specialty:  Obstetrics and Gynecology   Contact information:   [address] [place] [address] [phone]       Signed: Brock Bad 08/20/2013, 6:12 AM

## 2013-08-20 NOTE — Progress Notes (Signed)
Patient ID: Colleen Schmidt, female   DOB: [birth year], 27 y.o.   MRN: [id number] Hospital Day: 5  S: Tolerating diet.  No vomiting.  O: Blood pressure 107/67, pulse 60, temperature 98.6 F (37 C), temperature source Oral, resp. rate 18, height 5\' 2"  (1.575 m), weight 140 lb (63.504 kg), last menstrual period 06/05/2013, SpO2 100.00%.   WGN:FAOZHYQM: 150-160 bpm Toco: None SVE:   A/P- 27 y.o. admitted with: N/V.  Stable.  Discharge home on continuation and completion of steroid taper.   Present on Admission:  **None**  Pregnancy Complications: Severe N/V  Preterm labor management: no treatment necessary Dating:  [redacted]w[redacted]d PNL Needed:  none FWB:  good PTL:  stable

## 2013-08-20 NOTE — Progress Notes (Signed)
Ur chart review completed.  

## 2013-08-29 ENCOUNTER — Encounter (HOSPITAL_COMMUNITY): Payer: Self-pay | Admitting: Obstetrics & Gynecology

## 2013-08-29 ENCOUNTER — Ambulatory Visit (INDEPENDENT_AMBULATORY_CARE_PROVIDER_SITE_OTHER): Payer: Medicaid Other | Admitting: Obstetrics & Gynecology

## 2013-08-29 VITALS — BP 118/81 | Temp 98.9°F | Wt 138.6 lb

## 2013-08-29 DIAGNOSIS — Z3481 Encounter for supervision of other normal pregnancy, first trimester: Secondary | ICD-10-CM

## 2013-08-29 DIAGNOSIS — Z3480 Encounter for supervision of other normal pregnancy, unspecified trimester: Secondary | ICD-10-CM | POA: Insufficient documentation

## 2013-08-29 LAB — POCT URINALYSIS DIPSTICK
Glucose, UA: NEGATIVE
Leukocytes, UA: NEGATIVE
Protein, UA: NEGATIVE
Spec Grav, UA: 1.02
Urobilinogen, UA: NEGATIVE

## 2013-08-29 NOTE — Progress Notes (Signed)
Pulse: 108

## 2013-08-30 ENCOUNTER — Other Ambulatory Visit: Payer: Self-pay | Admitting: Obstetrics & Gynecology

## 2013-08-30 DIAGNOSIS — Z3682 Encounter for antenatal screening for nuchal translucency: Secondary | ICD-10-CM

## 2013-08-30 LAB — OBSTETRIC PANEL: Rubella: 1.57 Index — ABNORMAL HIGH (ref <0.90)

## 2013-08-30 LAB — VITAMIN D 25 HYDROXY (VIT D DEFICIENCY, FRACTURES): Vit D, 25-Hydroxy: 10 ng/mL — ABNORMAL LOW (ref 30–89)

## 2013-08-30 LAB — VARICELLA ZOSTER ANTIBODY, IGG: Varicella IgG: 257.7 Index — ABNORMAL HIGH (ref <135.00)

## 2013-08-31 ENCOUNTER — Ambulatory Visit (HOSPITAL_COMMUNITY)
Admission: RE | Admit: 2013-08-31 | Discharge: 2013-08-31 | Disposition: A | Discharge status: Home / Self Care | Payer: Medicaid Other | Source: Ambulatory Visit | Attending: Obstetrics & Gynecology | Admitting: Obstetrics & Gynecology

## 2013-08-31 ENCOUNTER — Other Ambulatory Visit: Payer: Self-pay

## 2013-08-31 ENCOUNTER — Encounter (HOSPITAL_COMMUNITY): Payer: Self-pay

## 2013-08-31 ENCOUNTER — Encounter: Payer: Self-pay | Admitting: Obstetrics & Gynecology

## 2013-08-31 VITALS — BP 117/64 | HR 97 | Wt 141.5 lb

## 2013-08-31 DIAGNOSIS — O210 Mild hyperemesis gravidarum: Secondary | ICD-10-CM

## 2013-08-31 DIAGNOSIS — O351XX0 Maternal care for (suspected) chromosomal abnormality in fetus, not applicable or unspecified: Secondary | ICD-10-CM | POA: Insufficient documentation

## 2013-08-31 DIAGNOSIS — Z3689 Encounter for other specified antenatal screening: Secondary | ICD-10-CM | POA: Insufficient documentation

## 2013-08-31 DIAGNOSIS — O3510X0 Maternal care for (suspected) chromosomal abnormality in fetus, unspecified, not applicable or unspecified: Secondary | ICD-10-CM | POA: Insufficient documentation

## 2013-08-31 DIAGNOSIS — Z3682 Encounter for antenatal screening for nuchal translucency: Secondary | ICD-10-CM

## 2013-08-31 LAB — HEMOGLOBINOPATHY EVALUATION
Hgb F Quant: 0.5 % (ref 0.0–2.0)
Hgb S Quant: 0 %

## 2013-08-31 LAB — CULTURE, OB URINE: Colony Count: NO GROWTH

## 2013-08-31 NOTE — Progress Notes (Signed)
Tolerating more po--less N/V.

## 2013-08-31 NOTE — Patient Instructions (Signed)
Prenatal Care  WHAT IS PRENATAL CARE?  Prenatal care means health care during your pregnancy, before your baby is born. Take care of yourself and your baby by:   Getting early prenatal care. If you know you are pregnant, or think you might be pregnant, call your caregiver as soon as possible. Schedule a visit for a general/prenatal examination.  Getting regular prenatal care. Follow your caregiver's schedule for blood and other necessary tests. Do not miss appointments.  Do everything you can to keep yourself and your baby healthy during your pregnancy.  Prenatal care should include evaluation of medical, dietary, educational, psychological, and social needs for the couple and the medical, surgical, and genetic history of the family of the mother and father.  Discuss with your caregiver:  Your medicines, prescription, over-the-counter, and herbal medicines.  Substance abuse, alcohol, smoking, and illegal drugs.  Domestic abuse and violence, if present.  Your immunizations.  Nutrition and diet.  Exercising.  Environment and occupational hazards, at home and at work.  History of sexually transmitted disease, both you and your partner.  Previous pregnancies. WHY IS PRENATAL CARE SO IMPORTANT?  By seeing you regularly, your caregiver has the chance to find problems early, so that they can be treated as soon as possible. Other problems might be prevented. Many studies have shown that early and regular prenatal care is important for the health of both mothers and their babies.  I AM THINKING ABOUT GETTING PREGNANT. HOW CAN I TAKE CARE OF MYSELF?  Taking care of yourself before you get pregnant helps you to have a healthy pregnancy. It also lowers your chances of having a baby born with a birth defect. Here are ways to take care of yourself before you get pregnant:   Eat healthy foods, exercise regularly (30 minutes per day for most days of the week is best), and get enough rest and  sleep. Talk to your caregiver about what kinds of foods and exercises are best for you.  Take 400 micrograms (mcg) of folic acid (one of the B vitamins) every day. The best way to do this is to take a daily multivitamin pill that contains this amount of folic acid. Getting enough of the synthetic (manufactured) form of folic acid every day before you get pregnant and during early pregnancy can help prevent certain birth defects. Many breakfast cereals and other grain products have folic acid added to them, but only certain cereals contain 400 mcg of folic acid per serving. Check the label on your multivitamin or cereal to find the amount of folic acid in the food.  See your caregiver for a complete check up before getting pregnant. Make sure that you have had all your immunization shots, especially for rubella (German measles). Rubella can cause serious birth defects. Chickenpox is another illness you want to avoid during pregnancy. If you have had chickenpox and rubella in the past, you should be immune to them.  Tell your caregiver about any prescription or non-prescription medicines (including herbal remedies) you are taking. Some medicines are not safe to take during pregnancy.  Stop smoking cigarettes, drinking alcohol, or taking illegal drugs. Ask your caregiver for help, if you need it. You can also get help with alcohol and drugs by talking with a member of your faith community, a counselor, or a trusted friend.  Discuss and treat any medical, social, or psychological problems before getting pregnant.  Discuss any history of genetic problems in the mother, father, and their families. Do   genetic testing before getting pregnant, when possible.  Discuss any physical or emotional abuse with your caregiver.  Discuss with your caregiver if you might be exposed to harmful chemicals on your job or where you live.  Discuss with your caregiver if you think your job or the hours you work may be  harmful and should be changed.  The father should be involved with the decision making and with all aspects of the pregnancy, labor, and delivery.  If you have medical insurance, make sure you are covered for pregnancy. I JUST FOUND OUT THAT I AM PREGNANT. HOW CAN I TAKE CARE OF MYSELF?  Here are ways to take care of yourself and the precious new life growing inside you:   Continue taking your multivitamin with 400 micrograms (mcg) of folic acid every day.  Get early and regular prenatal care. It does not matter if this is your first pregnancy or if you already have children. It is very important to see a caregiver during your pregnancy. Your caregiver will check at each visit to make sure that you and the baby are healthy. If there are any problems, action can be taken right away to help you and the baby.  Eat a healthy diet that includes:  Fruits.  Vegetables.  Foods low in saturated fat.  Grains.  Calcium-rich foods.  Drink 6 to 8 glasses of liquids a day.  Unless your caregiver tells you not to, try to be physically active for 30 minutes, most days of the week. If you are pressed for time, you can get your activity in through 10 minute segments, three times a day.  If you smoke, drink alcohol, or use drugs, STOP. These can cause long-term damage to your baby. Talk with your caregiver about steps to take to stop smoking. Talk with a member of your faith community, a counselor, a trusted friend, or your caregiver if you are concerned about your alcohol or drug use.  Ask your caregiver before taking any medicine, even over-the-counter medicines. Some medicines are not safe to take during pregnancy.  Get plenty of rest and sleep.  Avoid hot tubs and saunas during pregnancy.  Do not have X-rays taken, unless absolutely necessary and with the recommendation of your caregiver. A lead shield can be placed on your abdomen, to protect the baby when X-rays are taken in other parts of the  body.  Do not empty the cat litter when you are pregnant. It may contain a parasite that causes an infection called toxoplasmosis, which can cause birth defects. Also, use gloves when working in garden areas used by cats.  Do not eat uncooked or undercooked cheese, meats, or fish.  Stay away from toxic chemicals like:  Insecticides.  Solvents (some cleaners or paint thinners).  Lead.  Mercury.  Sexual relations may continue until the end of the pregnancy, unless you have a medical problem or there is a problem with the pregnancy and your caregiver tells you not to.  Do not wear high heel shoes, especially during the second half of the pregnancy. You can lose your balance and fall.  Do not take long trips, unless absolutely necessary. Be sure to see your caregiver before going on the trip.  Do not sit in one position for more than 2 hours, when on a trip.  Take a copy of your medical records when going on a trip.  Know where there is a hospital in the city you are visiting, in case of an   emergency.  Most dangerous household products will have pregnancy warnings on their labels. Ask your caregiver about products if you are unsure.  Limit or eliminate your caffeine intake from coffee, tea, sodas, medicines, and chocolate.  Many women continue working through pregnancy. Staying active might help you stay healthier. If you have a question about the safety or the hours you work at your particular job, talk with your caregiver.  Get informed:  Read books.  Watch videos.  Go to childbirth classes for you and the father.  Talk with experienced moms.  Ask your caregiver about childbirth education classes for you and your partner. Classes can help you and your partner prepare for the birth of your baby.  Ask about a pediatrician (baby doctor) and methods and pain medicine for labor, delivery, and possible Cesarean delivery (C-section). I AM NOT THINKING ABOUT GETTING PREGNANT  RIGHT NOW, BUT HEARD THAT ALL WOMEN SHOULD TAKE FOLIC ACID EVERY DAY?  All women of childbearing age, with even a remote chance of getting pregnant, should try to make sure they get enough folic acid. Many pregnancies are not planned. Many women do not know they are actually pregnant early in their pregnancies, and certain birth defects happen in the very early part of pregnancy. Taking 400 micrograms (mcg) of folic acid every day will help prevent certain birth defects that happen in the early part of pregnancy. If a woman begins taking vitamin pills in the second or third month of pregnancy, it may be too late to prevent birth defects. Folic acid may also have other health benefits for women, besides preventing birth defects.  HOW OFTEN SHOULD I SEE MY CAREGIVER DURING PREGNANCY?  Your caregiver will give you a schedule for your prenatal visits. You will have visits more often as you get closer to the end of your pregnancy. An average pregnancy lasts about 40 weeks.  A typical schedule includes visiting your caregiver:   About once each month, during your first 6 months of pregnancy.  Every 2 weeks, during the next 2 months.  Weekly in the last month, until the delivery date. Your caregiver will probably want to see you more often if:  You are over 35.  Your pregnancy is high risk, because you have certain health problems or problems with the pregnancy, such as:  Diabetes.  High blood pressure.  The baby is not growing on schedule, according to the dates of the pregnancy. Your caregiver will do special tests, to make sure you and the baby are not having any serious problems. WHAT HAPPENS DURING PRENATAL VISITS?   At your first prenatal visit, your caregiver will talk to you about you and your partner's health history and your family's health history, and will do a physical exam.  On your first visit, a physical exam will include checks of your blood pressure, height and weight, and an  exam of your pelvic organs. Your caregiver will do a Pap test if you have not had one recently, and will do cultures of your cervix to make sure there is no infection.  At each visit, there will be tests of your blood, urine, blood pressure, weight, and checking the progress of the baby.  Your caregiver will be able to tell you when to expect that your baby will be born.  Each visit is also a chance for you to learn about staying healthy during pregnancy and for asking questions.  Discuss whether you will be breastfeeding.  At your later prenatal   visits, your caregiver will check how you are doing and how the baby is developing. You may have a number of tests done as your pregnancy progresses.  Ultrasound exams are often used to check on the baby's growth and health.  You may have more urine and blood tests, as well as special tests, if needed. These may include amniocentesis (examine fluid in the pregnancy sac), stress tests (check how baby responds to contractions), biophysical profile (measures fetus well-being). Your caregiver will explain the tests and why they are necessary. I AM IN MY LATE THIRTIES, AND I WANT TO HAVE A CHILD NOW. SHOULD I DO ANYTHING SPECIAL?  As you get older, there is more chance of having a medical problem (high blood pressure), pregnancy problem (preeclampsia, problems with the placenta), miscarriage, or a baby born with a birth defect. However, most women in their late thirties and early forties have healthy babies. See your caregiver on a regular basis before you get pregnant and be sure to go for exams throughout your pregnancy. Your caregiver probably will want to do some special tests to check on you and your baby's health when you are pregnant.  Women today are often delaying having children until later in life, when they are in their thirties and forties. While many women in their thirties and forties have no difficulty getting pregnant, fertility does decline  with age. For women over 40 who cannot get pregnant after 6 months of trying, it is recommended that they see their caregiver for a fertility evaluation. It is not uncommon to have trouble becoming pregnant or experience infertility (inability to become pregnant after trying for one year). If you think that you or your partner may be infertile, you can discuss this with your caregiver. He or she can recommend treatments such as drugs, surgery, or assisted reproductive technology.  Document Released: 12/16/2003 Document Revised: 03/06/2012 Document Reviewed: 11/12/2009 [employer] Patient Information 2014 [employer].  

## 2013-09-05 ENCOUNTER — Ambulatory Visit (INDEPENDENT_AMBULATORY_CARE_PROVIDER_SITE_OTHER): Payer: Medicaid Other | Admitting: Obstetrics & Gynecology

## 2013-09-05 VITALS — BP 120/69 | Temp 99.2°F | Wt 144.0 lb

## 2013-09-05 DIAGNOSIS — Z3482 Encounter for supervision of other normal pregnancy, second trimester: Secondary | ICD-10-CM

## 2013-09-05 DIAGNOSIS — R1115 Cyclical vomiting syndrome unrelated to migraine: Secondary | ICD-10-CM

## 2013-09-05 DIAGNOSIS — Z3480 Encounter for supervision of other normal pregnancy, unspecified trimester: Secondary | ICD-10-CM

## 2013-09-05 DIAGNOSIS — R1110 Vomiting, unspecified: Secondary | ICD-10-CM

## 2013-09-05 LAB — POCT URINALYSIS DIPSTICK: Bilirubin, UA: NEGATIVE

## 2013-09-05 LAB — COMPREHENSIVE METABOLIC PANEL
Albumin: 3.5 g/dL (ref 3.5–5.2)
BUN: 7 mg/dL (ref 6–23)
CO2: 24 mEq/L (ref 19–32)
Calcium: 8.9 mg/dL (ref 8.4–10.5)
Chloride: 102 mEq/L (ref 96–112)
Glucose, Bld: 83 mg/dL (ref 70–99)
Potassium: 3.7 mEq/L (ref 3.5–5.3)

## 2013-09-05 LAB — CBC
HCT: 35.1 % — ABNORMAL LOW (ref 36.0–46.0)
Hemoglobin: 11.9 g/dL — ABNORMAL LOW (ref 12.0–15.0)
WBC: 8.8 10*3/uL (ref 4.0–10.5)

## 2013-09-05 NOTE — Progress Notes (Signed)
P=[phone] 

## 2013-09-05 NOTE — Progress Notes (Signed)
Declined flu vaccine.  Referral-->Nutritionist.

## 2013-09-06 ENCOUNTER — Encounter: Payer: Self-pay | Admitting: Obstetrics & Gynecology

## 2013-09-06 NOTE — Patient Instructions (Signed)
Influenza Vaccine (Flu Vaccine, Inactivated) 2013 2014 What You Need to Know WHY GET VACCINATED?  Influenza ("flu") is a contagious disease that spreads around the [place] every winter, usually between October and May.  Flu is caused by the influenza virus, and can be spread by coughing, sneezing, and close contact.  Anyone can get flu, but the risk of getting flu is highest among children. Symptoms come on suddenly and may last several days. They can include:  Fever or chills.  Sore throat.  Muscle aches.  Fatigue.  Cough.  Headache.  Runny or stuffy nose. Flu can make some people much sicker than others. These people include young children, people 65 and older, pregnant women, and people with certain health conditions such as heart, lung or kidney disease, or a weakened immune system. Flu vaccine is especially important for these people, and anyone in close contact with them. Flu can also lead to pneumonia, and make existing medical conditions worse. It can cause diarrhea and seizures in children. Each year thousands of people in the [place] die from flu, and many more are hospitalized. Flu vaccine is the best protection we have from flu and its complications. Flu vaccine also helps prevent spreading flu from person to person. INACTIVATED FLU VACCINE There are 2 types of influenza vaccine:  You are getting an inactivated flu vaccine, which does not contain any live influenza virus. It is given by injection with a needle, and often called the "flu shot."  A different live, attenuated (weakened) influenza vaccine is sprayed into the nostrils. This vaccine is described in a separate Vaccine Information Statement. Flu vaccine is recommended every year. Children 6 months through 8 years of age should get 2 doses the first year they get vaccinated. Flu viruses are always changing. Each year's flu vaccine is made to protect from viruses that are most likely to cause disease  that year. While flu vaccine cannot prevent all cases of flu, it is our best defense against the disease. Inactivated flu vaccine protects against 3 or 4 different influenza viruses. It takes about 2 weeks for protection to develop after the vaccination, and protection lasts several months to a year. Some illnesses that are not caused by influenza virus are often mistaken for flu. Flu vaccine will not prevent these illnesses. It can only prevent influenza. A "high-dose" flu vaccine is available for people 65 years of age and older. The person giving you the vaccine can tell you more about it. Some inactivated flu vaccine contains a very small amount of a mercury-based preservative called thimerosal. Studies have shown that thimerosal in vaccines is not harmful, but flu vaccines that do not contain a preservative are available. SOME PEOPLE SHOULD NOT GET THIS VACCINE Tell the person who gives you the vaccine:  If you have any severe (life-threatening) allergies. If you ever had a life-threatening allergic reaction after a dose of flu vaccine, or have a severe allergy to any part of this vaccine, you may be advised not to get a dose. Most, but not all, types of flu vaccine contain a small amount of egg.  If you ever had Guillain Barr Syndrome (a severe paralyzing illness, also called GBS). Some people with a history of GBS should not get this vaccine. This should be discussed with your doctor.  If you are not feeling well. They might suggest waiting until you feel better. But you should come back. RISKS OF A VACCINE REACTION With a vaccine, like any medicine, there   is a chance of side effects. These are usually mild and go away on their own. Serious side effects are also possible, but are very rare. Inactivated flu vaccine does not contain live flu virus, sogetting flu from this vaccine is not possible. Brief fainting spells and related symptoms (such as jerking movements) can happen after any medical  procedure, including vaccination. Sitting or lying down for about 15 minutes after a vaccination can help prevent fainting and injuries caused by falls. Tell your doctor if you feel dizzy or lightheaded, or have vision changes or ringing in the ears. Mild problems following inactivated flu vaccine:  Soreness, redness, or swelling where the shot was given.  Hoarseness; sore, red or itchy eyes; or cough.  Fever.  Aches.  Headache.  Itching.  Fatigue. If these problems occur, they usually begin soon after the shot and last 1 or 2 days. Moderate problems following inactivated flu vaccine:  Young children who get inactivated flu vaccine and pneumococcal vaccine (PCV13) at the same time may be at increased risk for seizures caused by fever. Ask your doctor for more information. Tell your doctor if a child who is getting flu vaccine has ever had a seizure. Severe problems following inactivated flu vaccine:  A severe allergic reaction could occur after any vaccine (estimated less than 1 in a million doses).  There is a small possibility that inactivated flu vaccine could be associated with Guillan Barr Syndrome (GBS), no more than 1 or 2 cases per million people vaccinated. This is much lower than the risk of severe complications from flu, which can be prevented by flu vaccine. The safety of vaccines is always being monitored. For more information, visit: www.cdc.gov/vaccinesafety/ WHAT IF THERE IS A SERIOUS REACTION? What should I look for?  Look for anything that concerns you, such as signs of a severe allergic reaction, very high fever, or behavior changes. Signs of a severe allergic reaction can include hives, swelling of the face and throat, difficulty breathing, a fast heartbeat, dizziness, and weakness. These would start a few minutes to a few hours after the vaccination. What should I do?  If you think it is a severe allergic reaction or other emergency that cannot wait, call [phone] 1 1  or get the person to the nearest hospital. Otherwise, call your doctor.  Afterward, the reaction should be reported to the Vaccine Adverse Event Reporting System (VAERS). Your doctor might file this report, or you can do it yourself through the VAERS website at www.vaers.hhs.gov, or by calling 1-800-822-7967. VAERS is only for reporting reactions. They do not give medical advice. THE NATIONAL VACCINE INJURY COMPENSATION PROGRAM The National Vaccine Injury Compensation Program (VICP) is a federal program that was created to compensate people who may have been injured by certain vaccines. Persons who believe they may have been injured by a vaccine can learn about the program and about filing a claim by calling 1-800-338-2382 or visiting the VICP website at www.hrsa.gov/vaccinecompensation HOW CAN I LEARN MORE?  Ask your doctor.  Call your local or state health department.  Contact the Centers for Disease Control and Prevention (CDC):  Call 1-800-232-4636 (1-800-CDC-INFO) or  Visit CDC's website at www.cdc.gov/flu CDC Inactivated Influenza Vaccine Interim VIS (07/21/12) Document Released: 10/07/2006 Document Revised: 09/06/2012 Document Reviewed: 07/21/2012 [employer] Patient Information 2014 [employer].  

## 2013-09-07 ENCOUNTER — Encounter: Payer: Self-pay | Admitting: Obstetrics & Gynecology

## 2013-09-07 DIAGNOSIS — Z3682 Encounter for antenatal screening for nuchal translucency: Secondary | ICD-10-CM | POA: Insufficient documentation

## 2013-10-03 ENCOUNTER — Ambulatory Visit (INDEPENDENT_AMBULATORY_CARE_PROVIDER_SITE_OTHER): Payer: Medicaid Other | Admitting: Obstetrics & Gynecology

## 2013-10-03 ENCOUNTER — Other Ambulatory Visit: Payer: Self-pay | Admitting: *Deleted

## 2013-10-03 ENCOUNTER — Encounter: Payer: Self-pay | Admitting: Obstetrics & Gynecology

## 2013-10-03 VITALS — BP 103/67 | Temp 98.9°F | Wt 141.0 lb

## 2013-10-03 DIAGNOSIS — Z3480 Encounter for supervision of other normal pregnancy, unspecified trimester: Secondary | ICD-10-CM

## 2013-10-03 DIAGNOSIS — Z3481 Encounter for supervision of other normal pregnancy, first trimester: Secondary | ICD-10-CM

## 2013-10-03 LAB — POCT URINALYSIS DIPSTICK
Blood, UA: NEGATIVE
Glucose, UA: NEGATIVE
Nitrite, UA: NEGATIVE
Urobilinogen, UA: NEGATIVE

## 2013-10-03 LAB — COMPREHENSIVE METABOLIC PANEL
ALT: <8 U/L (ref 0–35)
AST: 10 U/L (ref 0–37)
CO2: 24 mEq/L (ref 19–32)
Calcium: 8.7 mg/dL (ref 8.4–10.5)
Chloride: 104 mEq/L (ref 96–112)
Creat: 0.53 mg/dL (ref 0.50–1.10)
Glucose, Bld: 85 mg/dL (ref 70–99)
Sodium: 135 mEq/L (ref 135–145)

## 2013-10-03 MED ORDER — BUTALBITAL-APAP-CAFFEINE 50-325-40 MG PO TABS: 1.0000 | ORAL_TABLET | Freq: Four times a day (QID) | ORAL | Status: DC | PRN

## 2013-10-03 MED ORDER — ENSURE COMPLETE SHAKE PO LIQD: 1.0000 | Freq: Two times a day (BID) | ORAL | Status: DC

## 2013-10-03 NOTE — Telephone Encounter (Signed)
Called script for Fioricet to pharmacy [facility]).

## 2013-10-03 NOTE — Patient Instructions (Signed)
AFP Maternal This is a routine screen (tests) used to check for fetal abnormalities such as Down syndrome and neural tube defects. Down Syndrome is a chromosomal abnormality, sometimes called Trisomy 21. Neural tube defects are serious birth defects. The brain, spinal cord, or their coverings do not develop completely. Women should be tested in the 15th to 20th week of pregnancy. The msAFP screen involves three or four tests that measure substances found in the blood that make the testing better. During development, AFP levels in fetal blood and amniotic fluid rise until about 12 weeks. The levels then gradually fall until birth. AFP is a protein produce by fetal tissue. AFP crosses the placenta and appears in the maternal blood. A baby with an open neural tube defect has an opening in its spine, head, or abdominal wall that allows higher-than-usual amounts of AFP to pass into the mother's blood. If a screen is positive, more tests are needed to make a diagnosis. These include ultrasound and perhaps amniocentesis (checking the fluid that surrounds the baby). These tests are used to help women and their caregivers make decisions about the management of their pregnancies. In pregnancies where the fetus is carrying the chromosomal defect that results in Down syndrome, the levels of AFP and unconjugated estriol tend to be low and hCG and inhibin A levels high.  PREPARATION FOR TEST Blood is drawn from a vein in your arm usually between the 15th and 20th weeks of pregnancy. Four different tests on your blood are done. These are AFP, hCG, unconjugated estriol, and inhibin A. The combination of tests produces a more accurate result. NORMAL FINDINGS   Adult: less than 40ng/mL or less than 40 mg/L (SI units)  Child younger than1 year: less than 30 ng/mL Ranges are stratified by weeks of gestation and vary among laboratories. Ranges for normal findings may vary among different laboratories and hospitals. You  should always check with your doctor after having lab work or other tests done to discuss the meaning of your test results and whether your values are considered within normal limits. MEANING OF TEST  These are screening tests. Not all fetal abnormalities will give positive test results. Of all women who have positive AFP screening results, only a very small number of them have babies who actually have a neural tube defect or chromosomal abnormality. Your caregiver will go over the test results with you and discuss the importance and meaning of your results, as well as treatment options and the need for additional tests if necessary. OBTAINING THE TEST RESULTS It is your responsibility to obtain your test results. Ask the lab or department performing the test when and how you will get your results. Document Released: 01/04/2005 Document Revised: 03/06/2012 Document Reviewed: 11/16/2008 Exit[employer] Patient Information 2014 [employer].  

## 2013-10-03 NOTE — Progress Notes (Signed)
P 97 Patient reports she is still having n/v- but not near as bad as it was. Patient states she is having heaaches- Tylenol does work.

## 2013-10-05 LAB — AFP, QUAD SCREEN
Curr Gest Age: 17.4 wks.days
Down Syndrome Scr Risk Est: 1:31000 {titer}
INH: 269.6 pg/mL
Interpretation-AFP: NEGATIVE
MoM for INH: 1.49
MoM for hCG: 1.13
Osb Risk: 1:3200 {titer}
Tri 18 Scr Risk Est: NEGATIVE
Trisomy 18 (Edward) Syndrome Interp.: <1:157000 {titer}
uE3 Value: 1.3 ng/mL

## 2013-10-09 ENCOUNTER — Ambulatory Visit (INDEPENDENT_AMBULATORY_CARE_PROVIDER_SITE_OTHER): Payer: Medicaid Other

## 2013-10-09 ENCOUNTER — Other Ambulatory Visit: Payer: Self-pay | Admitting: Obstetrics & Gynecology

## 2013-10-09 ENCOUNTER — Other Ambulatory Visit: Payer: Self-pay | Admitting: *Deleted

## 2013-10-09 DIAGNOSIS — Z1389 Encounter for screening for other disorder: Secondary | ICD-10-CM

## 2013-10-09 DIAGNOSIS — Z3480 Encounter for supervision of other normal pregnancy, unspecified trimester: Secondary | ICD-10-CM

## 2013-10-09 MED ORDER — OB COMPLETE PETITE 35-5-1-200 MG PO CAPS: 1.0000 | ORAL_CAPSULE | Freq: Every day | ORAL | Status: DC

## 2013-10-10 ENCOUNTER — Encounter: Payer: Self-pay | Admitting: Obstetrics & Gynecology

## 2013-10-10 LAB — US OB DETAIL + 14 WK

## 2013-10-16 ENCOUNTER — Ambulatory Visit: Payer: Medicaid Other | Admitting: *Deleted

## 2013-10-31 ENCOUNTER — Ambulatory Visit (INDEPENDENT_AMBULATORY_CARE_PROVIDER_SITE_OTHER): Payer: Medicaid Other | Admitting: Obstetrics & Gynecology

## 2013-10-31 ENCOUNTER — Encounter: Payer: Self-pay | Admitting: Obstetrics & Gynecology

## 2013-10-31 VITALS — BP 104/67 | Temp 98.5°F | Wt 148.5 lb

## 2013-10-31 DIAGNOSIS — Z3480 Encounter for supervision of other normal pregnancy, unspecified trimester: Secondary | ICD-10-CM

## 2013-10-31 DIAGNOSIS — Z3483 Encounter for supervision of other normal pregnancy, third trimester: Secondary | ICD-10-CM

## 2013-10-31 LAB — POCT URINALYSIS DIPSTICK
Nitrite, UA: NEGATIVE
Protein, UA: NEGATIVE
Urobilinogen, UA: 4

## 2013-10-31 NOTE — Progress Notes (Signed)
HR - 92 Pt in office for routine OB visit, states she has an increase in vaginal discharge, states it is thick and white, denies irritation or odor.  Considering sterilization.

## 2013-10-31 NOTE — Patient Instructions (Signed)
Tetanus, Diphtheria (Td) Vaccine What You Need to Know WHY GET VACCINATED? Tetanus  and diphtheria are very serious diseases. They are rare in the [place] today, but people who do become infected often have severe complications. Td vaccine is used to protect adolescents and adults from both of these diseases. Both tetanus and diphtheria are infections caused by bacteria. Diphtheria spreads from person to person through coughing or sneezing. Tetanus-causing bacteria enter the body through cuts, scratches, or wounds. TETANUS (Lockjaw) causes painful muscle tightening and stiffness, usually all over the body.  It can lead to tightening of muscles in the head and neck so you can't open your mouth, swallow, or sometimes even breathe. Tetanus kills about 1 out of every 5 people who are infected. DIPHTHERIA can cause a thick coating to form in the back of the throat.  It can lead to breathing problems, paralysis, heart failure, and death. Before vaccines, the [place] saw as many as 200,000 cases a year of diphtheria and hundreds of cases of tetanus. Since vaccination began, cases of both diseases have dropped by about 99%. TD VACCINE Td vaccine can protect adolescents and adults from tetanus and diphtheria. Td is usually given as a booster dose every 10 years but it can also be given earlier after a severe and dirty wound or burn. Your doctor can give you more information. Td may safely be given at the same time as other vaccines. SOME PEOPLE SHOULD NOT GET THIS VACCINE  If you ever had a life-threatening allergic reaction after a dose of any tetanus or diphtheria containing vaccine, OR if you have a severe allergy to any part of this vaccine, you should not get Td. Tell your doctor if you have any severe allergies.  Talk to your doctor if you:  have epilepsy or another nervous system problem,  had severe pain or swelling after any vaccine containing diphtheria or tetanus,  ever had  Guillain Barr Syndrome (GBS),  aren't feeling well on the day the shot is scheduled. RISKS OF A VACCINE REACTION With a vaccine, like any medicine, there is a chance of side effects. These are usually mild and go away on their own. Serious side effects are also possible, but are very rare. Most people who get Td vaccine do not have any problems with it. Mild Problems  following Td (Did not interfere with activities)  Pain where the shot was given (about 8 people in 10)  Redness or swelling where the shot was given (about 1 person in 3)  Mild fever (about 1 person in 15)  Headache or Tiredness (uncommon) Moderate Problems following Td (Interfered with activities, but did not require medical attention)  Fever over 102 F (38.9 C) (rare) Severe Problems  following Td (Unable to perform usual activities; required medical attention)  Swelling, severe pain, bleeding, or redness in the arm where the shot was given (rare). Problems that could happen after any vaccine:  Brief fainting spells can happen after any medical procedure, including vaccination. Sitting or lying down for about 15 minutes can help prevent fainting, and injuries caused by a fall. Tell your doctor if you feel dizzy, or have vision changes or ringing in the ears.  Severe shoulder pain and reduced range of motion in the arm where a shot was given can happen, very rarely, after a vaccination.  Severe allergic reactions from a vaccine are very rare, estimated at less than 1 in a million doses. If one were to occur, it would   usually be within a few minutes to a few hours after the vaccination. WHAT IF THERE IS A SERIOUS REACTION? What should I look for?  Look for anything that concerns you, such as signs of a severe allergic reaction, very high fever, or behavior changes. Signs of a severe allergic reaction can include hives, swelling of the face and throat, difficulty breathing, a fast heartbeat, dizziness, and  weakness. These would usually start a few minutes to a few hours after the vaccination. What should I do?  If you think it is a severe allergic reaction or other emergency that can't wait, call 911 or get the person to the nearest hospital. Otherwise, call your doctor.  Afterward, the reaction should be reported to the Vaccine Adverse Event Reporting System (VAERS). Your doctor might file this report, or, you can do it yourself through the VAERS website or by calling 1-508 748 9566. VAERS is only for reporting reactions. They do not give medical advice. THE NATIONAL VACCINE INJURY COMPENSATION PROGRAM The National Vaccine Injury Compensation Program (VICP) is a federal program that was created to compensate people who may have been injured by certain vaccines. Persons who believe they may have been injured by a vaccine can learn about the program and about filing a claim by calling 1-(910)270-0020 or visiting the [facility] website. HOW CAN I LEARN MORE?  Ask your doctor.  Contact your local or state health department.  Contact the Centers for Disease Control and Prevention (CDC):  Call (226)129-7264 (1-800-CDC-INFO)  Visit CDC's vaccines website CDC Td Vaccine Interim VIS (01/30/13) Document Released: 10/10/2006 Document Revised: 04/09/2013 Document Reviewed: 04/04/2013 [facility] Patient Information 2014 [place], [place]. Glucose Tolerance Test This is a test to see how your body processes carbohydrates. This test is often done to check patients for diabetes or the possibility of developing it. PREPARATION FOR TEST You should have nothing to eat or drink 12 hours before the test. You will be given a form of sugar (glucose) and then blood samples will be drawn from your vein to determine the level of sugar in your blood. Alternatively, blood may be drawn from your finger for testing. You should not smoke or exercise during the test. NORMAL FINDINGS  Fasting: 70-115 mg/dL  30 minutes: less than  200 mg/dL  1 hour: less than 962 mg/dL  2 hours: less than 952 mg/dL  3 hours: 84-132 mg/dL  4 hours: 44-010 mg/dL Ranges for normal findings may vary among different laboratories and hospitals. You should always check with your doctor after having lab work or other tests done to discuss the meaning of your test results and whether your values are considered within normal limits. MEANING OF TEST Your caregiver will go over the test results with you and discuss the importance and meaning of your results, as well as treatment options and the need for additional tests. OBTAINING THE TEST RESULTS It is your responsibility to obtain your test results. Ask the lab or department performing the test when and how you will get your results. Document Released: 01/05/2005 Document Revised: 03/06/2012 Document Reviewed: 11/23/2008 [facility] Patient Information 2014 [place], [place]. Sterilization Information, Female Female sterilization is a procedure to permanently prevent pregnancy. There are different ways to perform sterilization, but all either block or close the fallopian tubes so that your eggs cannot reach your uterus. If your egg cannot reach your uterus, sperm cannot fertilize the egg, and you cannot get pregnant.  Sterilization is performed by a surgical procedure. Sometimes these procedures are performed in  a hospital while a patient is asleep. Sometimes they can be done in a clinic setting with the patient awake. The fallopian tubes can be surgically cut, tied, or sealed through a procedure called tubal ligation. The fallopian tubes can also be closed with clips or rings. Sterilization can also be done by placing a tiny coil into each fallopian tube, which causes scar tissue to grow inside the tube. The scar tissue then blocks the tubes.  Discuss sterilization with your caregiver to answer any concerns you or your partner may have. You may want to ask what type of sterilization your caregiver  performs. Some caregivers may not perform all the various options. Sterilization is permanent and should only be done if you are sure you do not want children or do not want any more children. Having a sterilization reversed may not be successful.  STERILIZATION PROCEDURES  Laparoscopic sterilization. This is a surgical method performed at a time other than right after childbirth. Two incisions are made in the lower abdomen. A thin, lighted tube (laparoscope) is inserted into one of the incisions and is used to perform the procedure. The fallopian tubes are closed with a ring or a clip. An instrument that uses heat could be used to seal the tubes closed (electrocautery).   Mini-laparotomy. This is a surgical method done 1 or 2 days after giving birth. Typically, a small incision is made just below the belly button (umbilicus) and the fallopian tubes are exposed. The tubes can then be sealed, tied, or cut.   Hysteroscopic sterilization. This is performed at a time other than right after childbirth. A tiny, spring-like coil is inserted through the cervix and uterus and placed into the fallopian tubes. The coil causes scaring and blocks the tubes. Other forms of contraception should be used for 3 months after the procedure to allow the scar tissue to form completely. Additionally, it is required hysterosalpingography be done 3 months later to ensure that the procedure was successful. Hysterosalpingography is a procedure that uses X-rays to look at your uterus and fallopian tubes after a material to make them show up better has been inserted. IS STERILIZATION SAFE? Sterilization is considered safe with very rare complications. Risks depend on the type of procedure you have. As with any surgical procedure, there are risks. Some risks of sterilization by any means include:   Bleeding.  Infection.  Reaction to anesthesia medicine.  Injury to surrounding organs. Risks specific to having hysteroscopic  coils placed include:  The coils may not be placed correctly the first time.   The coils may move out of place.   The tubes may not get completely blocked after 3 months.   Injury to surrounding organs when placing the coil.  HOW EFFECTIVE IS FEMALE STERILIZATION? Sterilization is nearly 100% effective, but it can fail. Depending on the type of sterilization, the rate of failure can be as high as 3%. After hysteroscopic sterilization with placement of fallopian tube coils, you will need back-up birth control for 3 months after the procedure. Sterilization is effective for a lifetime.  BENEFITS OF STERILIZATION  It does not affect your hormones, and therefore will not affect your menstrual periods, sexual desire, or performance.   It is effective for a lifetime.   It is safe.   You do not need to worry about getting pregnant. Keep in mind that if you had the hysteroscopic placement procedure, you must wait 3 months after the procedure (or until your caregiver confirms) before pregnancy  is not considered possible.   There are no side effects unlike other types of birth control (contraception).  DRAWBACKS OF STERILIZATION  You must be sure you do not want children or any more children. The procedure is permanent.   It does not provide protection against sexually transmitted infections (STIs).   The tubes can grow back together. If this happens, there is a risk of pregnancy. There is also an increased risk (50%) of pregnancy being an ectopic pregnancy. This is a pregnancy that happens outside of the uterus. Document Released: 05/31/2008 Document Revised: 06/13/2012 Document Reviewed: 03/30/2012 [facility] Patient Information 2014 [place], [place].

## 2013-11-28 ENCOUNTER — Ambulatory Visit (INDEPENDENT_AMBULATORY_CARE_PROVIDER_SITE_OTHER): Payer: Medicaid Other | Admitting: Obstetrics & Gynecology

## 2013-11-28 ENCOUNTER — Other Ambulatory Visit: Payer: Medicaid Other

## 2013-11-28 VITALS — BP 108/68 | Temp 98.3°F | Wt 155.0 lb

## 2013-11-28 DIAGNOSIS — Z3482 Encounter for supervision of other normal pregnancy, second trimester: Secondary | ICD-10-CM

## 2013-11-28 DIAGNOSIS — Z3480 Encounter for supervision of other normal pregnancy, unspecified trimester: Secondary | ICD-10-CM

## 2013-11-28 LAB — CBC
Hemoglobin: 9.5 g/dL — ABNORMAL LOW (ref 12.0–15.0)
Platelets: 220 10*3/uL (ref 150–400)
RBC: 3.13 MIL/uL — ABNORMAL LOW (ref 3.87–5.11)
RDW: 13.4 % (ref 11.5–15.5)
WBC: 9 10*3/uL (ref 4.0–10.5)

## 2013-11-28 LAB — POCT URINALYSIS DIPSTICK
Bilirubin, UA: NEGATIVE
Ketones, UA: NEGATIVE
Leukocytes, UA: NEGATIVE
Spec Grav, UA: 1.015
pH, UA: 5

## 2013-11-28 NOTE — Progress Notes (Deleted)
HR 94 Subjective:    Colleen Schmidt is a 27 y.o. female being seen today for her obstetrical visit. She is at [redacted]w[redacted]d gestation. Patient reports no complaints. Fetal movement: normal.  Menstrual History: OB History   Grav Para Term Preterm Abortions TAB SAB Ect Mult Living   2 1 1       1       Menarche age: 37 Patient's last menstrual period was 06/02/2013.    {Common ambulatory SmartLinks:19316}  Review of Systems {ros; complete:30496}   Objective:    BP 108/68  Temp(Src) 98.3 F (36.8 C)  Wt 155 lb (70.308 kg)  LMP 06/02/2013 FHT: *** BPM  Uterine Size: {fundal height:14540}     Assessment:    Pregnancy {numbers; 0-42:17906} and {numbers; 0-7:15237}/7 weeks   Plan:    {ob counseling 24-27:14519} Follow up in {numbers 0-4:31231} weeks.

## 2013-11-29 LAB — GLUCOSE TOLERANCE, 2 HOURS W/ 1HR
Glucose, 1 hour: 113 mg/dL (ref 70–170)
Glucose, Fasting: 67 mg/dL — ABNORMAL LOW (ref 70–99)

## 2013-11-29 LAB — HIV ANTIBODY (ROUTINE TESTING W REFLEX): HIV: NONREACTIVE

## 2013-11-29 NOTE — Progress Notes (Signed)
Doing well 

## 2013-11-29 NOTE — Patient Instructions (Signed)

## 2013-12-09 ENCOUNTER — Encounter: Payer: Self-pay | Admitting: Obstetrics & Gynecology

## 2013-12-09 DIAGNOSIS — D649 Anemia, unspecified: Secondary | ICD-10-CM | POA: Insufficient documentation

## 2013-12-11 ENCOUNTER — Other Ambulatory Visit: Payer: Self-pay | Admitting: *Deleted

## 2013-12-11 DIAGNOSIS — D649 Anemia, unspecified: Secondary | ICD-10-CM

## 2013-12-11 MED ORDER — FUSION PLUS PO CAPS: 1.0000 | ORAL_CAPSULE | Freq: Every day | ORAL | Status: DC

## 2013-12-26 ENCOUNTER — Other Ambulatory Visit: Payer: Self-pay | Admitting: Obstetrics & Gynecology

## 2013-12-26 ENCOUNTER — Ambulatory Visit (INDEPENDENT_AMBULATORY_CARE_PROVIDER_SITE_OTHER): Payer: Medicaid Other | Admitting: Obstetrics & Gynecology

## 2013-12-26 VITALS — BP 114/69 | Temp 98.4°F | Wt 162.0 lb

## 2013-12-26 DIAGNOSIS — D649 Anemia, unspecified: Secondary | ICD-10-CM

## 2013-12-26 DIAGNOSIS — Z23 Encounter for immunization: Secondary | ICD-10-CM

## 2013-12-26 DIAGNOSIS — Z3480 Encounter for supervision of other normal pregnancy, unspecified trimester: Secondary | ICD-10-CM

## 2013-12-26 DIAGNOSIS — Z3483 Encounter for supervision of other normal pregnancy, third trimester: Secondary | ICD-10-CM

## 2013-12-26 LAB — IRON: Iron: 48 ug/dL (ref 42–145)

## 2013-12-26 LAB — FERRITIN: Ferritin: 20 ng/mL (ref 10–291)

## 2013-12-26 LAB — POCT URINALYSIS DIPSTICK
Bilirubin, UA: NEGATIVE
Leukocytes, UA: NEGATIVE
Protein, UA: NEGATIVE
Spec Grav, UA: 1.01
Urobilinogen, UA: NEGATIVE
pH, UA: 7

## 2013-12-26 LAB — IBC PANEL
TIBC: 368 ug/dL (ref 250–470)
UIBC: 320 ug/dL (ref 125–400)

## 2013-12-26 NOTE — Progress Notes (Signed)
Pulse 88 Pt states that she has been having some cramping that comes and goes.  Pt states they feel as though her cycle is about to start.  Pt states she has been having the cramping for past couple weeks.  Pt would like to discuss her d/c. Pt states that some days she has d/c and some days there is none.

## 2013-12-27 NOTE — L&D Delivery Note (Signed)
Delivery Note At 7:42 AM a viable female was delivered via Vaginal, Spontaneous Delivery (Presentation: Left Occiput Anterior).  APGAR: 8, 9; weight 7 lb 8.1 oz (3405 g).   Placenta status: Intact, Spontaneous via Tomasa BlaseSchultz, meconium-stained  Cord: 3 vessels with the following complications: None.  There was an episode of mild atony--approximately 100 ml of clot were removed from the LUS.  Anesthesia: Epidural  Episiotomy: None Lacerations: 2nd degree, labial  Suture Repair: 2.0 3.0 vicryl rapide Est. Blood Loss (mL): 500  Mom to postpartum.  Baby to Couplet care / Skin to Skin.  JACKSON-MOORE,Albino Bufford A 03/18/2014, 9:50 AM

## 2013-12-28 NOTE — Patient Instructions (Signed)

## 2014-01-09 ENCOUNTER — Ambulatory Visit (INDEPENDENT_AMBULATORY_CARE_PROVIDER_SITE_OTHER): Payer: Medicaid Other | Admitting: Obstetrics & Gynecology

## 2014-01-09 VITALS — BP 113/72 | Temp 98.2°F | Wt 161.0 lb

## 2014-01-09 DIAGNOSIS — O289 Unspecified abnormal findings on antenatal screening of mother: Secondary | ICD-10-CM

## 2014-01-09 DIAGNOSIS — Z3480 Encounter for supervision of other normal pregnancy, unspecified trimester: Secondary | ICD-10-CM

## 2014-01-09 DIAGNOSIS — Z3682 Encounter for antenatal screening for nuchal translucency: Secondary | ICD-10-CM

## 2014-01-09 LAB — POCT URINALYSIS DIPSTICK
Bilirubin, UA: NEGATIVE
Glucose, UA: NEGATIVE
Ketones, UA: NEGATIVE
NITRITE UA: NEGATIVE
RBC UA: NEGATIVE
Spec Grav, UA: 1.01
UROBILINOGEN UA: NEGATIVE
pH, UA: 8

## 2014-01-09 NOTE — Patient Instructions (Signed)
Third Trimester of Pregnancy  The third trimester is from week 29 through week 42, months 7 through 9. The third trimester is a time when the fetus is growing rapidly. At the end of the ninth month, the fetus is about 20 inches in length and weighs 6 10 pounds.   BODY CHANGES  Your body goes through many changes during pregnancy. The changes vary from woman to woman.    Your weight will continue to increase. You can expect to gain 25 35 pounds (11 16 kg) by the end of the pregnancy.   You may begin to get stretch marks on your hips, abdomen, and breasts.   You may urinate more often because the fetus is moving lower into your pelvis and pressing on your bladder.   You may develop or continue to have heartburn as a result of your pregnancy.   You may develop constipation because certain hormones are causing the muscles that push waste through your intestines to slow down.   You may develop hemorrhoids or swollen, bulging veins (varicose veins).   You may have pelvic pain because of the weight gain and pregnancy hormones relaxing your joints between the bones in your pelvis. Back aches may result from over exertion of the muscles supporting your posture.   Your breasts will continue to grow and be tender. A yellow discharge may leak from your breasts called colostrum.   Your belly button may stick out.   You may feel short of breath because of your expanding uterus.   You may notice the fetus "dropping," or moving lower in your abdomen.   You may have a bloody mucus discharge. This usually occurs a few days to a week before labor begins.   Your cervix becomes thin and soft (effaced) near your due date.  WHAT TO EXPECT AT YOUR PRENATAL EXAMS   You will have prenatal exams every 2 weeks until week 36. Then, you will have weekly prenatal exams. During a routine prenatal visit:   You will be weighed to make sure you and the fetus are growing normally.   Your blood pressure is taken.   Your abdomen will be  measured to track your baby's growth.   The fetal heartbeat will be listened to.   Any test results from the previous visit will be discussed.   You may have a cervical check near your due date to see if you have effaced.  At around 36 weeks, your caregiver will check your cervix. At the same time, your caregiver will also perform a test on the secretions of the vaginal tissue. This test is to determine if a type of bacteria, Group B streptococcus, is present. Your caregiver will explain this further.  Your caregiver may ask you:   What your birth plan is.   How you are feeling.   If you are feeling the baby move.   If you have had any abnormal symptoms, such as leaking fluid, bleeding, severe headaches, or abdominal cramping.   If you have any questions.  Other tests or screenings that may be performed during your third trimester include:   Blood tests that check for low iron levels (anemia).   Fetal testing to check the health, activity level, and growth of the fetus. Testing is done if you have certain medical conditions or if there are problems during the pregnancy.  FALSE LABOR  You may feel small, irregular contractions that eventually go away. These are called Braxton Hicks contractions, or   false labor. Contractions may last for hours, days, or even weeks before true labor sets in. If contractions come at regular intervals, intensify, or become painful, it is best to be seen by your caregiver.   SIGNS OF LABOR    Menstrual-like cramps.   Contractions that are 5 minutes apart or less.   Contractions that start on the top of the uterus and spread down to the lower abdomen and back.   A sense of increased pelvic pressure or back pain.   A watery or bloody mucus discharge that comes from the vagina.  If you have any of these signs before the 37th week of pregnancy, call your caregiver right away. You need to go to the hospital to get checked immediately.  HOME CARE INSTRUCTIONS    Avoid all  smoking, herbs, alcohol, and unprescribed drugs. These chemicals affect the formation and growth of the baby.   Follow your caregiver's instructions regarding medicine use. There are medicines that are either safe or unsafe to take during pregnancy.   Exercise only as directed by your caregiver. Experiencing uterine cramps is a good sign to stop exercising.   Continue to eat regular, healthy meals.   Wear a good support bra for breast tenderness.   Do not use hot tubs, steam rooms, or saunas.   Wear your seat belt at all times when driving.   Avoid raw meat, uncooked cheese, cat litter boxes, and soil used by cats. These carry germs that can cause birth defects in the baby.   Take your prenatal vitamins.   Try taking a stool softener (if your caregiver approves) if you develop constipation. Eat more high-fiber foods, such as fresh vegetables or fruit and whole grains. Drink plenty of fluids to keep your urine clear or pale yellow.   Take warm sitz baths to soothe any pain or discomfort caused by hemorrhoids. Use hemorrhoid cream if your caregiver approves.   If you develop varicose veins, wear support hose. Elevate your feet for 15 minutes, 3 4 times a day. Limit salt in your diet.   Avoid heavy lifting, wear low heal shoes, and practice good posture.   Rest a lot with your legs elevated if you have leg cramps or low back pain.   Visit your dentist if you have not gone during your pregnancy. Use a soft toothbrush to brush your teeth and be gentle when you floss.   A sexual relationship may be continued unless your caregiver directs you otherwise.   Do not travel far distances unless it is absolutely necessary and only with the approval of your caregiver.   Take prenatal classes to understand, practice, and ask questions about the labor and delivery.   Make a trial run to the hospital.   Pack your hospital bag.   Prepare the baby's nursery.   Continue to go to all your prenatal visits as directed  by your caregiver.  SEEK MEDICAL CARE IF:   You are unsure if you are in labor or if your water has broken.   You have dizziness.   You have mild pelvic cramps, pelvic pressure, or nagging pain in your abdominal area.   You have persistent nausea, vomiting, or diarrhea.   You have a bad smelling vaginal discharge.   You have pain with urination.  SEEK IMMEDIATE MEDICAL CARE IF:    You have a fever.   You are leaking fluid from your vagina.   You have spotting or bleeding from your vagina.     You have severe abdominal cramping or pain.   You have rapid weight loss or gain.   You have shortness of breath with chest pain.   You notice sudden or extreme swelling of your face, hands, ankles, feet, or legs.   You have not felt your baby move in over an hour.   You have severe headaches that do not go away with medicine.   You have vision changes.  Document Released: 12/07/2001 Document Revised: 08/15/2013 Document Reviewed: 02/13/2013  [employer] Patient Information 2014 [employer].

## 2014-01-09 NOTE — Progress Notes (Signed)
Pulse- 103 Patient states she is having a increased clear discharge.

## 2014-01-23 ENCOUNTER — Ambulatory Visit (INDEPENDENT_AMBULATORY_CARE_PROVIDER_SITE_OTHER): Payer: Medicaid Other | Admitting: Obstetrics & Gynecology

## 2014-01-23 VITALS — BP 100/66 | Temp 98.9°F | Wt 168.0 lb

## 2014-01-23 DIAGNOSIS — Z3480 Encounter for supervision of other normal pregnancy, unspecified trimester: Secondary | ICD-10-CM

## 2014-01-23 DIAGNOSIS — N760 Acute vaginitis: Secondary | ICD-10-CM

## 2014-01-23 LAB — WET PREP BY MOLECULAR PROBE
Candida species: POSITIVE — AB
Gardnerella vaginalis: POSITIVE — AB
Trichomonas vaginosis: NEGATIVE

## 2014-01-23 LAB — POCT URINALYSIS DIPSTICK
Bilirubin, UA: NEGATIVE
Blood, UA: NEGATIVE
Glucose, UA: NEGATIVE
Ketones, UA: NEGATIVE
Nitrite, UA: NEGATIVE
Spec Grav, UA: 1.015
UROBILINOGEN UA: NEGATIVE
pH, UA: 6

## 2014-01-23 NOTE — Patient Instructions (Signed)
Third Trimester of Pregnancy  The third trimester is from week 29 through week 42, months 7 through 9. The third trimester is a time when the fetus is growing rapidly. At the end of the ninth month, the fetus is about 20 inches in length and weighs 6 10 pounds.   BODY CHANGES  Your body goes through many changes during pregnancy. The changes vary from woman to woman.    Your weight will continue to increase. You can expect to gain 25 35 pounds (11 16 kg) by the end of the pregnancy.   You may begin to get stretch marks on your hips, abdomen, and breasts.   You may urinate more often because the fetus is moving lower into your pelvis and pressing on your bladder.   You may develop or continue to have heartburn as a result of your pregnancy.   You may develop constipation because certain hormones are causing the muscles that push waste through your intestines to slow down.   You may develop hemorrhoids or swollen, bulging veins (varicose veins).   You may have pelvic pain because of the weight gain and pregnancy hormones relaxing your joints between the bones in your pelvis. Back aches may result from over exertion of the muscles supporting your posture.   Your breasts will continue to grow and be tender. A yellow discharge may leak from your breasts called colostrum.   Your belly button may stick out.   You may feel short of breath because of your expanding uterus.   You may notice the fetus "dropping," or moving lower in your abdomen.   You may have a bloody mucus discharge. This usually occurs a few days to a week before labor begins.   Your cervix becomes thin and soft (effaced) near your due date.  WHAT TO EXPECT AT YOUR PRENATAL EXAMS   You will have prenatal exams every 2 weeks until week 36. Then, you will have weekly prenatal exams. During a routine prenatal visit:   You will be weighed to make sure you and the fetus are growing normally.   Your blood pressure is taken.   Your abdomen will be  measured to track your baby's growth.   The fetal heartbeat will be listened to.   Any test results from the previous visit will be discussed.   You may have a cervical check near your due date to see if you have effaced.  At around 36 weeks, your caregiver will check your cervix. At the same time, your caregiver will also perform a test on the secretions of the vaginal tissue. This test is to determine if a type of bacteria, Group B streptococcus, is present. Your caregiver will explain this further.  Your caregiver may ask you:   What your birth plan is.   How you are feeling.   If you are feeling the baby move.   If you have had any abnormal symptoms, such as leaking fluid, bleeding, severe headaches, or abdominal cramping.   If you have any questions.  Other tests or screenings that may be performed during your third trimester include:   Blood tests that check for low iron levels (anemia).   Fetal testing to check the health, activity level, and growth of the fetus. Testing is done if you have certain medical conditions or if there are problems during the pregnancy.  FALSE LABOR  You may feel small, irregular contractions that eventually go away. These are called Braxton Hicks contractions, or   false labor. Contractions may last for hours, days, or even weeks before true labor sets in. If contractions come at regular intervals, intensify, or become painful, it is best to be seen by your caregiver.   SIGNS OF LABOR    Menstrual-like cramps.   Contractions that are 5 minutes apart or less.   Contractions that start on the top of the uterus and spread down to the lower abdomen and back.   A sense of increased pelvic pressure or back pain.   A watery or bloody mucus discharge that comes from the vagina.  If you have any of these signs before the 37th week of pregnancy, call your caregiver right away. You need to go to the hospital to get checked immediately.  HOME CARE INSTRUCTIONS    Avoid all  smoking, herbs, alcohol, and unprescribed drugs. These chemicals affect the formation and growth of the baby.   Follow your caregiver's instructions regarding medicine use. There are medicines that are either safe or unsafe to take during pregnancy.   Exercise only as directed by your caregiver. Experiencing uterine cramps is a good sign to stop exercising.   Continue to eat regular, healthy meals.   Wear a good support bra for breast tenderness.   Do not use hot tubs, steam rooms, or saunas.   Wear your seat belt at all times when driving.   Avoid raw meat, uncooked cheese, cat litter boxes, and soil used by cats. These carry germs that can cause birth defects in the baby.   Take your prenatal vitamins.   Try taking a stool softener (if your caregiver approves) if you develop constipation. Eat more high-fiber foods, such as fresh vegetables or fruit and whole grains. Drink plenty of fluids to keep your urine clear or pale yellow.   Take warm sitz baths to soothe any pain or discomfort caused by hemorrhoids. Use hemorrhoid cream if your caregiver approves.   If you develop varicose veins, wear support hose. Elevate your feet for 15 minutes, 3 4 times a day. Limit salt in your diet.   Avoid heavy lifting, wear low heal shoes, and practice good posture.   Rest a lot with your legs elevated if you have leg cramps or low back pain.   Visit your dentist if you have not gone during your pregnancy. Use a soft toothbrush to brush your teeth and be gentle when you floss.   A sexual relationship may be continued unless your caregiver directs you otherwise.   Do not travel far distances unless it is absolutely necessary and only with the approval of your caregiver.   Take prenatal classes to understand, practice, and ask questions about the labor and delivery.   Make a trial run to the hospital.   Pack your hospital bag.   Prepare the baby's nursery.   Continue to go to all your prenatal visits as directed  by your caregiver.  SEEK MEDICAL CARE IF:   You are unsure if you are in labor or if your water has broken.   You have dizziness.   You have mild pelvic cramps, pelvic pressure, or nagging pain in your abdominal area.   You have persistent nausea, vomiting, or diarrhea.   You have a bad smelling vaginal discharge.   You have pain with urination.  SEEK IMMEDIATE MEDICAL CARE IF:    You have a fever.   You are leaking fluid from your vagina.   You have spotting or bleeding from your vagina.     You have severe abdominal cramping or pain.   You have rapid weight loss or gain.   You have shortness of breath with chest pain.   You notice sudden or extreme swelling of your face, hands, ankles, feet, or legs.   You have not felt your baby move in over an hour.   You have severe headaches that do not go away with medicine.   You have vision changes.  Document Released: 12/07/2001 Document Revised: 08/15/2013 Document Reviewed: 02/13/2013  [employer] Patient Information 2014 [employer].

## 2014-01-23 NOTE — Progress Notes (Signed)
Pulse: 91  Patient states she is having contractions but that they are irregular like braxton hicks. Patient states that she would like to have her discharge checked. Patient states that it is thick mucous like discharge but that it is also watery. Patient states that she is wearing panty liners and that she has to change them 3-4 times a day. SPEC: yellow, curd-like discharge.

## 2014-02-06 ENCOUNTER — Ambulatory Visit (INDEPENDENT_AMBULATORY_CARE_PROVIDER_SITE_OTHER): Payer: Medicaid Other | Admitting: Obstetrics & Gynecology

## 2014-02-06 VITALS — BP 120/67 | Temp 98.2°F | Wt 168.0 lb

## 2014-02-06 DIAGNOSIS — Z3480 Encounter for supervision of other normal pregnancy, unspecified trimester: Secondary | ICD-10-CM

## 2014-02-06 LAB — POCT URINALYSIS DIPSTICK
Bilirubin, UA: NEGATIVE
Blood, UA: NEGATIVE
GLUCOSE UA: NEGATIVE
Ketones, UA: NEGATIVE
Nitrite, UA: NEGATIVE
SPEC GRAV UA: 1.02
UROBILINOGEN UA: NEGATIVE
pH, UA: 6

## 2014-02-06 NOTE — Patient Instructions (Signed)
Group B Streptococcus Infection During Pregnancy Group B streptococcus (GBS) is a type of bacteria often found in healthy women. GBS usually does not cause any symptoms or harm to healthy adult women, but the bacteria can make a baby very sick if it is passed to the baby during childbirth. GBS is not a sexually transmitted disease (STD). GBS is different from Group A streptococcus, the bacteria that causes "strep throat." CAUSES  GBS bacteria can be found in the intestinal, reproductive, and urinary tracts of women. It can also be found in the female genital tract, most often in the vagina and rectal areas.  SYMPTOMS  In pregnancy, GBS can be in the following places:  Genital tract without symptoms.  Rectum without symptoms.  Urine with or without symptoms (asymptomatic bacteriuria).  Urinary symptoms can include pain, frequency, urgency, and blood with urination (cystitis). Pregnant women who are infected with GBS are at increased risk of:  Early (premature) labor and delivery.  Prolonged rupture of the membranes.  Infection in the following places:  Bladder.  Kidneys (pyelonephritis).  Bag of waters or placenta (chorioamnionitis).  Uterus (endometritis) after delivery. Newborns who are infected with GBS can develop:  Lung infection (pneumonia).  Blood infection (septicemia).  Infection of the lining of the brain and spinal cord (meningitis). DIAGNOSIS  Diagnosis of GBS infection is made by screening tests done when you are 35 to [redacted] weeks pregnant. The test (culture) is an easy swab of the vagina and rectum. A sample of your urine might also be checked for the bacteria. Talk with your caregiver about a plan for labor if your test shows that you carry the GBS bacteria. TREATMENT  If results of the culture are positive, showing that GBS is present, you likely will receive treatment with antibiotic medicines during labor. This will help prevent GBS from being passed to your baby.  The antibiotics work only if they are given during labor. If treatment is given earlier in pregnancy, the bacteria may regrow and be present during labor. Tell your caregiver if you are allergic to penicillin or other antibiotics. Antibiotics are also given if:   Infection of the membranes (amnionitis) is suspected.  Labor begins or there is rupture of the membranes before 37 weeks of pregnancy and there is a high risk of delivering the baby.  The mother has a past history of giving birth to an infant with GBS infection.  The culture status is unknown (culture not performed or result not available) and there is:  Fever during labor.  Preterm labor (less than 37 weeks of pregnancy).  Prolonged rupture of membranes (18 hours or more). Treatment of the mother during labor is not recommended when:  A planned cesarean delivery is done and there is no labor or ruptured membranes. This is true even if the mother tested positive for GBS.  There is a negative culture for GBS screening during the pregnancy, regardless of the risk factors during labor. The infant will receive antibiotics if the infant tested positive for GBS or has signs and symptoms that suggest GBS infection is present. It is not recommended to routinely give antibiotics to infants whose mothers received antibiotic treatment during labor. HOME CARE INSTRUCTIONS   Take all antibiotics as prescribed by your caregiver.  Only take medicine as directed by your caregiver.  Continue with prenatal visits and care.  Return for follow-up appointments and cultures.  Follow your caregiver's instructions. SEEK MEDICAL CARE IF:   You have pain with urination.    You have frequent urination.  You have blood in your urine. SEEK IMMEDIATE MEDICAL CARE IF:  You have a fever.  You have pain in the back, side, or uterus.  You have chills.  You have abdominal swelling (distension) or pain.  You have labor pains (contractions) every  10 minutes or more often.  You are leaking fluid or bleeding from your vagina.  You have pelvic pressure that feels like your baby is pushing down.  You have a low, dull backache.  You have cramps that feel like your period.  You have abdominal cramps with or without diarrhea.  You have repeated vomiting and diarrhea.  You have trouble breathing.  You develop confusion.  You have stiffness of your body or neck. MAKE SURE YOU:   Understand these instructions.  Will watch your condition.  Will get help right away if you are not doing well or get worse. Document Released: 03/21/2008 Document Revised: 03/06/2012 Document Reviewed: 04/25/2011 [employer] Patient Information 2014 [employer].  

## 2014-02-06 NOTE — Progress Notes (Signed)
GBS and GC/CH done  At today's visit.

## 2014-02-06 NOTE — Progress Notes (Signed)
P 97 Patient reports she is still have heavy, slimy discharge. Patient is having R pain and pressure- she is on her feet all day.

## 2014-02-07 LAB — GC/CHLAMYDIA PROBE AMP
CT Probe RNA: POSITIVE — AB
GC Probe RNA: NEGATIVE

## 2014-02-08 ENCOUNTER — Encounter: Payer: Self-pay | Admitting: Obstetrics & Gynecology

## 2014-02-08 ENCOUNTER — Other Ambulatory Visit: Payer: Self-pay | Admitting: *Deleted

## 2014-02-08 DIAGNOSIS — O98819 Other maternal infectious and parasitic diseases complicating pregnancy, unspecified trimester: Secondary | ICD-10-CM

## 2014-02-08 DIAGNOSIS — A749 Chlamydial infection, unspecified: Secondary | ICD-10-CM | POA: Insufficient documentation

## 2014-02-08 LAB — STREP B DNA PROBE: STREP GROUP B AG: NEGATIVE

## 2014-02-08 MED ORDER — AZITHROMYCIN 250 MG PO TABS: 1000.0000 mg | ORAL_TABLET | Freq: Once | ORAL | Status: DC

## 2014-02-11 ENCOUNTER — Other Ambulatory Visit: Payer: Self-pay | Admitting: *Deleted

## 2014-02-11 DIAGNOSIS — N760 Acute vaginitis: Principal | ICD-10-CM

## 2014-02-11 DIAGNOSIS — B9689 Other specified bacterial agents as the cause of diseases classified elsewhere: Secondary | ICD-10-CM

## 2014-02-11 MED ORDER — METRONIDAZOLE 500 MG PO TABS: 500.0000 mg | ORAL_TABLET | Freq: Two times a day (BID) | ORAL | Status: DC

## 2014-02-20 ENCOUNTER — Ambulatory Visit (INDEPENDENT_AMBULATORY_CARE_PROVIDER_SITE_OTHER): Payer: Medicaid Other | Admitting: Obstetrics & Gynecology

## 2014-02-20 VITALS — BP 124/75 | Wt 174.0 lb

## 2014-02-20 DIAGNOSIS — Z3480 Encounter for supervision of other normal pregnancy, unspecified trimester: Secondary | ICD-10-CM

## 2014-02-20 LAB — POCT URINALYSIS DIPSTICK
Bilirubin, UA: NEGATIVE
Blood, UA: NEGATIVE
Glucose, UA: NEGATIVE
Ketones, UA: NEGATIVE
Nitrite, UA: NEGATIVE
PH UA: 7
SPEC GRAV UA: 1.02
UROBILINOGEN UA: NEGATIVE

## 2014-02-20 NOTE — Progress Notes (Signed)
Pulse 104 Pt is doing well.

## 2014-02-23 ENCOUNTER — Encounter (HOSPITAL_COMMUNITY): Payer: Self-pay | Admitting: *Deleted

## 2014-02-23 ENCOUNTER — Inpatient Hospital Stay (HOSPITAL_COMMUNITY)
Admission: AD | Admit: 2014-02-23 | Discharge: 2014-02-23 | Disposition: A | Discharge status: Home / Self Care | Payer: Medicaid Other | Source: Ambulatory Visit | Attending: Obstetrics | Admitting: Obstetrics

## 2014-02-23 DIAGNOSIS — O368130 Decreased fetal movements, third trimester, not applicable or unspecified: Secondary | ICD-10-CM

## 2014-02-23 DIAGNOSIS — O368190 Decreased fetal movements, unspecified trimester, not applicable or unspecified: Secondary | ICD-10-CM | POA: Insufficient documentation
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
## 2014-02-23 NOTE — Progress Notes (Signed)
Dr Gaynell FaceMarshall notified of pt's complaints of decrease in fetal movement and reactive NST. Orders received to d/c home

## 2014-02-23 NOTE — MAU Note (Signed)
Pt states hasnt felt good fm for past 3 days, FHT's in triage 128, has been having ctx's. No office visit in past 3 weeks r/t weather
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
## 2014-02-23 NOTE — Discharge Instructions (Signed)
Fetal Movement Counts Patient Name: __________________________________________________ Patient Due Date: ____________________ Performing a fetal movement count is highly recommended in high-risk pregnancies, but it is good for every pregnant woman to do. Your caregiver may ask you to start counting fetal movements at 28 weeks of the pregnancy. Fetal movements often increase:  After eating a full meal.  After physical activity.  After eating or drinking something sweet or cold.  At rest. Pay attention to when you feel the baby is most active. This will help you notice a pattern of your baby's sleep and wake cycles and what factors contribute to an increase in fetal movement. It is important to perform a fetal movement count at the same time each day when your baby is normally most active.  HOW TO COUNT FETAL MOVEMENTS 1. Find a quiet and comfortable area to sit or lie down on your left side. Lying on your left side provides the best blood and oxygen circulation to your baby. 2. Write down the day and time on a sheet of paper or in a journal. 3. Start counting kicks, flutters, swishes, rolls, or jabs in a 2 hour period. You should feel at least 10 movements within 2 hours. 4. If you do not feel 10 movements in 2 hours, wait 2 3 hours and count again. Look for a change in the pattern or not enough counts in 2 hours. SEEK MEDICAL CARE IF:  You feel less than 10 counts in 2 hours, tried twice.  There is no movement in over an hour.  The pattern is changing or taking longer each day to reach 10 counts in 2 hours.  You feel the baby is not moving as he or she usually does. Date: ____________ Movements: ____________ Start time: ____________ Finish time: ____________  Date: ____________ Movements: ____________ Start time: ____________ Finish time: ____________ Date: ____________ Movements: ____________ Start time: ____________ Finish time: ____________ Date: ____________ Movements: ____________  Start time: ____________ Finish time: ____________ Date: ____________ Movements: ____________ Start time: ____________ Finish time: ____________ Date: ____________ Movements: ____________ Start time: ____________ Finish time: ____________ Date: ____________ Movements: ____________ Start time: ____________ Finish time: ____________ Date: ____________ Movements: ____________ Start time: ____________ Finish time: ____________  Date: ____________ Movements: ____________ Start time: ____________ Finish time: ____________ Date: ____________ Movements: ____________ Start time: ____________ Finish time: ____________ Date: ____________ Movements: ____________ Start time: ____________ Finish time: ____________ Date: ____________ Movements: ____________ Start time: ____________ Finish time: ____________ Date: ____________ Movements: ____________ Start time: ____________ Finish time: ____________ Date: ____________ Movements: ____________ Start time: ____________ Finish time: ____________ Date: ____________ Movements: ____________ Start time: ____________ Finish time: ____________  Date: ____________ Movements: ____________ Start time: ____________ Finish time: ____________ Date: ____________ Movements: ____________ Start time: ____________ Finish time: ____________ Date: ____________ Movements: ____________ Start time: ____________ Finish time: ____________ Date: ____________ Movements: ____________ Start time: ____________ Finish time: ____________ Date: ____________ Movements: ____________ Start time: ____________ Finish time: ____________ Date: ____________ Movements: ____________ Start time: ____________ Finish time: ____________ Date: ____________ Movements: ____________ Start time: ____________ Finish time: ____________  Date: ____________ Movements: ____________ Start time: ____________ Finish time: ____________ Date: ____________ Movements: ____________ Start time: ____________ Finish time:  ____________ Date: ____________ Movements: ____________ Start time: ____________ Finish time: ____________ Date: ____________ Movements: ____________ Start time: ____________ Finish time: ____________ Date: ____________ Movements: ____________ Start time: ____________ Finish time: ____________ Date: ____________ Movements: ____________ Start time: ____________ Finish time: ____________ Date: ____________ Movements: ____________ Start time: ____________ Finish time: ____________  Date: ____________ Movements: ____________ Start time: ____________ Finish   time: ____________ Date: ____________ Movements: ____________ Start time: ____________ Finish time: ____________ Date: ____________ Movements: ____________ Start time: ____________ Finish time: ____________ Date: ____________ Movements: ____________ Start time: ____________ Finish time: ____________ Date: ____________ Movements: ____________ Start time: ____________ Finish time: ____________ Date: ____________ Movements: ____________ Start time: ____________ Finish time: ____________ Date: ____________ Movements: ____________ Start time: ____________ Finish time: ____________  Date: ____________ Movements: ____________ Start time: ____________ Finish time: ____________ Date: ____________ Movements: ____________ Start time: ____________ Finish time: ____________ Date: ____________ Movements: ____________ Start time: ____________ Finish time: ____________ Date: ____________ Movements: ____________ Start time: ____________ Finish time: ____________ Date: ____________ Movements: ____________ Start time: ____________ Finish time: ____________ Date: ____________ Movements: ____________ Start time: ____________ Finish time: ____________ Date: ____________ Movements: ____________ Start time: ____________ Finish time: ____________  Date: ____________ Movements: ____________ Start time: ____________ Finish time: ____________ Date: ____________ Movements:  ____________ Start time: ____________ Finish time: ____________ Date: ____________ Movements: ____________ Start time: ____________ Finish time: ____________ Date: ____________ Movements: ____________ Start time: ____________ Finish time: ____________ Date: ____________ Movements: ____________ Start time: ____________ Finish time: ____________ Date: ____________ Movements: ____________ Start time: ____________ Finish time: ____________ Date: ____________ Movements: ____________ Start time: ____________ Finish time: ____________  Date: ____________ Movements: ____________ Start time: ____________ Finish time: ____________ Date: ____________ Movements: ____________ Start time: ____________ Finish time: ____________ Date: ____________ Movements: ____________ Start time: ____________ Finish time: ____________ Date: ____________ Movements: ____________ Start time: ____________ Finish time: ____________ Date: ____________ Movements: ____________ Start time: ____________ Finish time: ____________ Date: ____________ Movements: ____________ Start time: ____________ Finish time: ____________ Document Released: 01/12/2007 Document Revised: 11/29/2012 Document Reviewed: 10/09/2012 [employer] Patient Information 2014 [employer].  

## 2014-02-25 ENCOUNTER — Ambulatory Visit (INDEPENDENT_AMBULATORY_CARE_PROVIDER_SITE_OTHER): Payer: Medicaid Other | Admitting: Obstetrics & Gynecology

## 2014-02-25 VITALS — BP 119/69 | Temp 98.0°F | Wt 176.0 lb

## 2014-02-25 DIAGNOSIS — O368190 Decreased fetal movements, unspecified trimester, not applicable or unspecified: Secondary | ICD-10-CM

## 2014-02-25 LAB — POCT URINALYSIS DIPSTICK
BILIRUBIN UA: NEGATIVE
Blood, UA: NEGATIVE
GLUCOSE UA: NEGATIVE
KETONES UA: NEGATIVE
NITRITE UA: NEGATIVE
PH UA: 5
Spec Grav, UA: 1.02
Urobilinogen, UA: NEGATIVE

## 2014-02-25 NOTE — Progress Notes (Signed)
P 84 Patient was seen at the hospital for decreased movement and swelling on Saturday- patient reports she is better, but still has decreased movement.

## 2014-03-02 ENCOUNTER — Inpatient Hospital Stay (HOSPITAL_COMMUNITY)
Admission: EM | Admit: 2014-03-02 | Discharge: 2014-03-02 | Disposition: A | Discharge status: Home / Self Care | Payer: Medicaid Other | Source: Ambulatory Visit | Attending: Obstetrics & Gynecology | Admitting: Obstetrics & Gynecology

## 2014-03-02 ENCOUNTER — Encounter (HOSPITAL_COMMUNITY): Payer: Self-pay | Admitting: *Deleted

## 2014-03-02 DIAGNOSIS — A749 Chlamydial infection, unspecified: Secondary | ICD-10-CM

## 2014-03-02 DIAGNOSIS — Z3480 Encounter for supervision of other normal pregnancy, unspecified trimester: Secondary | ICD-10-CM

## 2014-03-02 DIAGNOSIS — Z3682 Encounter for antenatal screening for nuchal translucency: Secondary | ICD-10-CM

## 2014-03-02 DIAGNOSIS — O210 Mild hyperemesis gravidarum: Secondary | ICD-10-CM

## 2014-03-02 DIAGNOSIS — B3731 Acute candidiasis of vulva and vagina: Secondary | ICD-10-CM | POA: Insufficient documentation

## 2014-03-02 DIAGNOSIS — R109 Unspecified abdominal pain: Secondary | ICD-10-CM | POA: Insufficient documentation

## 2014-03-02 DIAGNOSIS — O2390 Unspecified genitourinary tract infection in pregnancy, unspecified trimester: Secondary | ICD-10-CM | POA: Insufficient documentation

## 2014-03-02 DIAGNOSIS — O98819 Other maternal infectious and parasitic diseases complicating pregnancy, unspecified trimester: Secondary | ICD-10-CM

## 2014-03-02 DIAGNOSIS — B373 Candidiasis of vulva and vagina: Secondary | ICD-10-CM

## 2014-03-02 DIAGNOSIS — D649 Anemia, unspecified: Secondary | ICD-10-CM

## 2014-03-02 DIAGNOSIS — O479 False labor, unspecified: Secondary | ICD-10-CM

## 2014-03-02 DIAGNOSIS — IMO0002 Reserved for concepts with insufficient information to code with codable children: Secondary | ICD-10-CM

## 2014-03-02 DIAGNOSIS — N949 Unspecified condition associated with female genital organs and menstrual cycle: Secondary | ICD-10-CM | POA: Insufficient documentation

## 2014-03-02 LAB — URINALYSIS, ROUTINE W REFLEX MICROSCOPIC
BILIRUBIN URINE: NEGATIVE
Glucose, UA: NEGATIVE mg/dL
Hgb urine dipstick: NEGATIVE
KETONES UR: NEGATIVE mg/dL
Nitrite: NEGATIVE
PH: 6 (ref 5.0–8.0)
PROTEIN: NEGATIVE mg/dL
Specific Gravity, Urine: 1.02 (ref 1.005–1.030)
Urobilinogen, UA: 0.2 mg/dL (ref 0.0–1.0)

## 2014-03-02 LAB — URINE MICROSCOPIC-ADD ON

## 2014-03-02 LAB — OB RESULTS CONSOLE GC/CHLAMYDIA
Chlamydia: NEGATIVE
Gonorrhea: NEGATIVE

## 2014-03-02 LAB — WET PREP, GENITAL
CLUE CELLS WET PREP: NONE SEEN
TRICH WET PREP: NONE SEEN

## 2014-03-02 MED ORDER — FLUCONAZOLE 150 MG PO TABS
150.0000 mg | ORAL_TABLET | Freq: Once | ORAL | Status: DC
Start: 2014-03-02 — End: 2014-03-19

## 2014-03-02 MED ORDER — ACETAMINOPHEN 500 MG PO TABS
1000.0000 mg | ORAL_TABLET | Freq: Once | ORAL | Status: AC
  Administered 2014-03-02: 1000 mg via ORAL
  Filled 2014-03-02: qty 2

## 2014-03-02 NOTE — MAU Note (Signed)
Lower pelvic pain with movement.  Feels like sharp pains through vaginal area

## 2014-03-02 NOTE — MAU Provider Note (Signed)
History     CSN: [id number]  Arrival date and time: 03/02/14 1309   First Provider Initiated Contact with Patient 03/02/14 1402      Chief Complaint  Patient presents with  . Pelvic Pain   HPI  Pt is 38w G2P1001 prestns with lower abd pain with movement and vaginal pain.  Pt has hx of trichomonas And pt thinks she might have another infection.  Pt denies spotting, bleeding, LOF or contractions. Pt has not taken anything for the pain.  Past Medical History  Diagnosis Date  . Pregnant   . Trichomonas     Past Surgical History  Procedure Laterality Date  . Wisdom tooth extraction      Family History  Problem Relation Age of Onset  . Hypertension Mother   . Cancer Maternal Grandmother     lung  . Hypertension Paternal Grandmother   . Cancer Paternal Grandfather     History  Substance Use Topics  . Smoking status: Never Smoker   . Smokeless tobacco: Never Used  . Alcohol Use: Yes     Comment: Social prior to preg.    Allergies: No Known Allergies  Prescriptions prior to admission  Medication Sig Dispense Refill  . metroNIDAZOLE (FLAGYL) 500 MG tablet Take 1 tablet (500 mg total) by mouth 2 (two) times daily.  14 tablet  0  . Prenat-FeCbn-FeAspGl-FA-Omega (OB COMPLETE PETITE) 35-5-1-200 MG CAPS Take 1 capsule by mouth daily.  30 capsule  11    Review of Systems  Constitutional: Negative for fever and chills.  Gastrointestinal: Positive for abdominal pain. Negative for nausea, vomiting, diarrhea and constipation.  Genitourinary: Negative for dysuria and urgency.   Physical Exam   Blood pressure 131/72, pulse 80, temperature 98 F (36.7 C), temperature source Oral, resp. rate 18, height 5\' 2"  (1.575 m), weight 79.833 kg (176 lb), last menstrual period 06/02/2013.  Physical Exam  Nursing note and vitals reviewed. Constitutional: She is oriented to person, place, and time. She appears well-developed and well-nourished.  HENT:  Head: Normocephalic.  Eyes:  Pupils are equal, round, and reactive to light.  Neck: Normal range of motion. Neck supple.  Cardiovascular: Normal rate.   Respiratory: Effort normal.  GI: Soft. She exhibits no distension.  occ ctx; acc 15x15 no dec; baseline 125 bpm  Genitourinary:  Small amount of yellow thick creamy discharge in vault; cervix 1 cm 50%   Musculoskeletal: Normal range of motion.  Neurological: She is alert and oriented to person, place, and time.  Skin: Skin is warm and dry.  Psychiatric: She has a normal mood and affect.    MAU Course  Procedures Results for orders placed during the hospital encounter of 03/02/14 (from the past 24 hour(s))  URINALYSIS, ROUTINE W REFLEX MICROSCOPIC     Status: Abnormal   Collection Time    03/02/14  1:15 PM      Result Value Ref Range   Color, Urine YELLOW  YELLOW   APPearance CLEAR  CLEAR   Specific Gravity, Urine 1.020  1.005 - 1.030   pH 6.0  5.0 - 8.0   Glucose, UA NEGATIVE  NEGATIVE mg/dL   Hgb urine dipstick NEGATIVE  NEGATIVE   Bilirubin Urine NEGATIVE  NEGATIVE   Ketones, ur NEGATIVE  NEGATIVE mg/dL   Protein, ur NEGATIVE  NEGATIVE mg/dL   Urobilinogen, UA 0.2  0.0 - 1.0 mg/dL   Nitrite NEGATIVE  NEGATIVE   Leukocytes, UA LARGE (*) NEGATIVE  URINE MICROSCOPIC-ADD ON  Status: Abnormal   Collection Time    03/02/14  1:15 PM      Result Value Ref Range   Squamous Epithelial / LPF FEW (*) RARE   WBC, UA 7-10  <3 WBC/hpf   RBC / HPF 0-2  <3 RBC/hpf   Bacteria, UA MANY (*) RARE  WET PREP, GENITAL     Status: Abnormal   Collection Time    03/02/14  1:50 PM      Result Value Ref Range   Yeast Wet Prep HPF POC MANY (*) NONE SEEN   Trich, Wet Prep NONE SEEN  NONE SEEN   Clue Cells Wet Prep HPF POC NONE SEEN  NONE SEEN   WBC, Wet Prep HPF POC MANY (*) NONE SEEN    Assessment and Plan  Yeast vaginitis- diflucan 150mg  now and repeat in 3 days Pelvic pain- Tylenol prn  Emeli Goguen 03/02/2014, 3:07 PM

## 2014-03-04 ENCOUNTER — Ambulatory Visit (INDEPENDENT_AMBULATORY_CARE_PROVIDER_SITE_OTHER): Payer: Medicaid Other | Admitting: Obstetrics & Gynecology

## 2014-03-04 VITALS — BP 122/78 | Temp 98.0°F | Wt 177.0 lb

## 2014-03-04 DIAGNOSIS — Z3480 Encounter for supervision of other normal pregnancy, unspecified trimester: Secondary | ICD-10-CM

## 2014-03-04 LAB — POCT URINALYSIS DIPSTICK
BILIRUBIN UA: NEGATIVE
Blood, UA: NEGATIVE
GLUCOSE UA: NEGATIVE
Ketones, UA: NEGATIVE
NITRITE UA: NEGATIVE
Protein, UA: NEGATIVE
Spec Grav, UA: 1.015
UROBILINOGEN UA: NEGATIVE
pH, UA: 5

## 2014-03-04 LAB — GC/CHLAMYDIA PROBE AMP
CT Probe RNA: NEGATIVE
GC Probe RNA: NEGATIVE

## 2014-03-04 NOTE — Progress Notes (Signed)
Pulse: 87 Patient state she is having lower abdominal pressure. Patient states she went to the hospital Saturday for Pelvic pain, Patient states she couldn't turn side to side. Patient states they told her it was just her ligaments stretching and gave her tylenol. Patient denies any concerns.
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
## 2014-03-09 ENCOUNTER — Encounter (HOSPITAL_COMMUNITY): Payer: Self-pay | Admitting: *Deleted

## 2014-03-09 ENCOUNTER — Inpatient Hospital Stay (HOSPITAL_COMMUNITY)
Admission: AD | Admit: 2014-03-09 | Discharge: 2014-03-09 | Disposition: A | Discharge status: Home / Self Care | Payer: Medicaid Other | Source: Ambulatory Visit | Attending: Obstetrics | Admitting: Obstetrics

## 2014-03-09 DIAGNOSIS — A749 Chlamydial infection, unspecified: Secondary | ICD-10-CM

## 2014-03-09 DIAGNOSIS — O368190 Decreased fetal movements, unspecified trimester, not applicable or unspecified: Secondary | ICD-10-CM | POA: Insufficient documentation

## 2014-03-09 DIAGNOSIS — O368130 Decreased fetal movements, third trimester, not applicable or unspecified: Secondary | ICD-10-CM

## 2014-03-09 DIAGNOSIS — IMO0002 Reserved for concepts with insufficient information to code with codable children: Secondary | ICD-10-CM

## 2014-03-09 HISTORY — DX: Other specified health status: Z78.9

## 2014-03-09 NOTE — MAU Note (Signed)
C/o no fetal movement since last night around 2000;

## 2014-03-09 NOTE — MAU Provider Note (Signed)
  History     CSN: [id number]  Arrival date and time: 03/09/14 1329   First Provider Initiated Contact with Patient 03/09/14 1421      Chief Complaint  Patient presents with  . Decreased Fetal Movement   HPI Colleen Schmidt is a 28 y.o. G2P1001 a 3187w0d. She presents with c/o decreased movement last evening, no movement this am. She woke up, ate, walked,-no movement. She now feels movement.  No bleeding or leaking, occ contractions, not in labor.     Past Medical History  Diagnosis Date  . Pregnant   . Trichomonas   . Medical history non-contributory     Past Surgical History  Procedure Laterality Date  . Wisdom tooth extraction      Family History  Problem Relation Age of Onset  . Hypertension Mother   . Cancer Maternal Grandmother     lung  . Hypertension Paternal Grandmother   . Cancer Paternal Grandfather     History  Substance Use Topics  . Smoking status: Never Smoker   . Smokeless tobacco: Never Used  . Alcohol Use: Yes     Comment: Social prior to preg.    Allergies: No Known Allergies  Prescriptions prior to admission  Medication Sig Dispense Refill  . acetaminophen (TYLENOL) 325 MG tablet Take 325 mg by mouth every 6 (six) hours as needed for headache.      . Prenat-FeCbn-FeAspGl-FA-Omega (OB COMPLETE PETITE) 35-5-1-200 MG CAPS Take 1 capsule by mouth daily.  30 capsule  11  . fluconazole (DIFLUCAN) 150 MG tablet Take 1 tablet (150 mg total) by mouth once.  2 tablet  3  . metroNIDAZOLE (FLAGYL) 500 MG tablet Take 1 tablet (500 mg total) by mouth 2 (two) times daily.  14 tablet  0    Review of Systems  Constitutional: Negative for fever and chills.  Gastrointestinal: Negative for nausea, vomiting, abdominal pain, diarrhea and constipation.  Genitourinary: Negative for dysuria, urgency and frequency.   Physical Exam   Blood pressure 116/72, pulse 98, temperature 98.8 F (37.1 C), temperature source Oral, resp. rate 18, height 5\' 2"  (1.575 m),  weight 177 lb (80.287 kg), last menstrual period 06/02/2013.  Physical Exam  Constitutional: She is oriented to person, place, and time. She appears well-developed and well-nourished.  GI: Soft. There is no tenderness. There is no guarding.  Musculoskeletal: Normal range of motion.  Neurological: She is alert and oriented to person, place, and time.  Skin: Skin is warm and dry.  Psychiatric: She has a normal mood and affect. Her behavior is normal.    MAU Course  Procedures  MDM Reactive strip Assessment and Plan  ASSESSMENT:  40 wks with hx no fetal movement, feeling movenetnow Reactive strip No S&S of labor or ROM  PLAN:  Kick counts [facility] 3[facility]16 [facility] Call the office with other changes or concerns  Consulted with Dr Ivery QualeHarper  [name], Avon GullyKATHY M. 03/09/2014, 2:24 PM

## 2014-03-09 NOTE — Discharge Instructions (Signed)
Keep up with fetal movement, if you feel it has decreased again, call the office or return here

## 2014-03-11 ENCOUNTER — Encounter: Payer: Self-pay | Admitting: Obstetrics

## 2014-03-11 ENCOUNTER — Ambulatory Visit (INDEPENDENT_AMBULATORY_CARE_PROVIDER_SITE_OTHER): Payer: Medicaid Other | Admitting: Obstetrics

## 2014-03-11 VITALS — BP 115/71 | Temp 97.2°F | Wt 176.0 lb

## 2014-03-11 DIAGNOSIS — O480 Post-term pregnancy: Secondary | ICD-10-CM

## 2014-03-11 DIAGNOSIS — Z3480 Encounter for supervision of other normal pregnancy, unspecified trimester: Secondary | ICD-10-CM

## 2014-03-11 LAB — POCT URINALYSIS DIPSTICK
Bilirubin, UA: NEGATIVE
Blood, UA: NEGATIVE
GLUCOSE UA: NEGATIVE
Ketones, UA: NEGATIVE
NITRITE UA: NEGATIVE
Spec Grav, UA: 1.02
UROBILINOGEN UA: NEGATIVE
pH, UA: 5

## 2014-03-11 NOTE — Progress Notes (Signed)
Pulse- 82 Pt states she is having round ligament pain.

## 2014-03-12 ENCOUNTER — Telehealth (HOSPITAL_COMMUNITY): Payer: Self-pay | Admitting: *Deleted

## 2014-03-12 NOTE — Telephone Encounter (Signed)
Preadmission screen  

## 2014-03-13 ENCOUNTER — Telehealth (HOSPITAL_COMMUNITY): Payer: Self-pay | Admitting: *Deleted

## 2014-03-13 ENCOUNTER — Encounter (HOSPITAL_COMMUNITY): Payer: Self-pay | Admitting: *Deleted

## 2014-03-13 NOTE — Telephone Encounter (Signed)
Preadmission screen  

## 2014-03-17 ENCOUNTER — Encounter (HOSPITAL_COMMUNITY): Payer: Self-pay

## 2014-03-17 ENCOUNTER — Inpatient Hospital Stay (HOSPITAL_COMMUNITY)
Admission: RE | Admit: 2014-03-17 | Discharge: 2014-03-19 | DRG: 775 | Disposition: A | Discharge status: Home / Self Care | Payer: Medicaid Other | Source: Ambulatory Visit | Attending: Obstetrics & Gynecology | Admitting: Obstetrics & Gynecology

## 2014-03-17 DIAGNOSIS — O480 Post-term pregnancy: Principal | ICD-10-CM | POA: Diagnosis present

## 2014-03-17 DIAGNOSIS — Z3490 Encounter for supervision of normal pregnancy, unspecified, unspecified trimester: Secondary | ICD-10-CM

## 2014-03-17 LAB — CBC
HCT: 34.3 % — ABNORMAL LOW (ref 36.0–46.0)
HEMOGLOBIN: 11.5 g/dL — AB (ref 12.0–15.0)
MCH: 29.9 pg (ref 26.0–34.0)
MCHC: 33.5 g/dL (ref 30.0–36.0)
MCV: 89.1 fL (ref 78.0–100.0)
Platelets: 175 10*3/uL (ref 150–400)
RBC: 3.85 MIL/uL — AB (ref 3.87–5.11)
RDW: 13.6 % (ref 11.5–15.5)
WBC: 8.2 10*3/uL (ref 4.0–10.5)

## 2014-03-17 LAB — RPR: RPR Ser Ql: NONREACTIVE

## 2014-03-17 MED ORDER — OXYTOCIN BOLUS FROM INFUSION
500.0000 mL | INTRAVENOUS | Status: DC
Start: 2014-03-17 — End: 2014-03-18

## 2014-03-17 MED ORDER — OXYCODONE-ACETAMINOPHEN 5-325 MG PO TABS: 1.0000 | ORAL_TABLET | ORAL | Status: DC | PRN

## 2014-03-17 MED ORDER — CITRIC ACID-SODIUM CITRATE 334-500 MG/5ML PO SOLN: 30.0000 mL | ORAL | Status: DC | PRN

## 2014-03-17 MED ORDER — LACTATED RINGERS IV SOLN
INTRAVENOUS | Status: DC
  Administered 2014-03-17 – 2014-03-18 (×3): via INTRAVENOUS

## 2014-03-17 MED ORDER — TERBUTALINE SULFATE 1 MG/ML IJ SOLN: 0.2500 mg | Freq: Once | INTRAMUSCULAR | Status: AC | PRN

## 2014-03-17 MED ORDER — OXYTOCIN 40 UNITS IN LACTATED RINGERS INFUSION - SIMPLE MED: 62.5000 mL/h | INTRAVENOUS | Status: DC

## 2014-03-17 MED ORDER — IBUPROFEN 600 MG PO TABS: 600.0000 mg | ORAL_TABLET | Freq: Four times a day (QID) | ORAL | Status: DC | PRN

## 2014-03-17 MED ORDER — OXYTOCIN 40 UNITS IN LACTATED RINGERS INFUSION - SIMPLE MED
1.0000 m[IU]/min | INTRAVENOUS | Status: DC
  Administered 2014-03-17: 1 m[IU]/min via INTRAVENOUS
  Filled 2014-03-17: qty 1000

## 2014-03-17 MED ORDER — ONDANSETRON HCL 4 MG/2ML IJ SOLN: 4.0000 mg | Freq: Four times a day (QID) | INTRAMUSCULAR | Status: DC | PRN

## 2014-03-17 MED ORDER — LIDOCAINE HCL (PF) 1 % IJ SOLN
30.0000 mL | INTRAMUSCULAR | Status: DC | PRN
  Administered 2014-03-18: 30 mL via SUBCUTANEOUS
  Filled 2014-03-17: qty 30

## 2014-03-17 MED ORDER — LACTATED RINGERS IV SOLN
500.0000 mL | INTRAVENOUS | Status: DC | PRN
  Administered 2014-03-17: 500 mL via INTRAVENOUS

## 2014-03-17 MED ORDER — ACETAMINOPHEN 325 MG PO TABS: 650.0000 mg | ORAL_TABLET | ORAL | Status: DC | PRN

## 2014-03-17 NOTE — H&P (Signed)
Colleen Schmidt is a 28 y.o. female presenting for IOL. Maternal Medical History:  Reason for admission: She is for IOL secondary to post dates.  Fetal activity: Perceived fetal activity is normal.    Prenatal complications: Infection: chlamydia, ASB.   Prenatal Complications - Diabetes: none.    OB History   Grav Para Term Preterm Abortions TAB SAB Ect Mult Living   2 1 1       1      Past Medical History  Diagnosis Date  . Pregnant   . Trichomonas   . Medical history non-contributory    Past Surgical History  Procedure Laterality Date  . Wisdom tooth extraction     Family History: family history includes Cancer in her maternal grandmother and paternal grandfather; Hypertension in her mother and paternal grandmother. Social History:  reports that she has never smoked. She has never used smokeless tobacco. She reports that she drinks alcohol. She reports that she does not use illicit drugs.     Review of Systems  Constitutional: Negative for fever.  Eyes: Negative for blurred vision.  Respiratory: Negative for shortness of breath.   Gastrointestinal: Negative for vomiting.  Skin: Negative for rash.  Neurological: Negative for headaches.    Dilation: 4 Effacement (%): 70;80 Station: -1 Exam by:: e. poore, rn Blood pressure 134/89, pulse 80, temperature 98.7 F (37.1 C), temperature source Oral, resp. rate 18, height 5\' 2"  (1.575 m), weight 79.833 kg (176 lb), last menstrual period 06/02/2013. Maternal Exam:  Uterine Assessment: Contraction frequency is irregular.   Abdomen: not evaluated.  Introitus: not evaluated.   Pelvis: adequate for delivery.   Cervix: Cervix evaluated by digital exam.     Fetal Exam Fetal Monitor Review: Baseline rate: 130.  Variability: moderate (6-25 bpm).   Pattern: accelerations present and no decelerations.    Fetal State Assessment: Category I - tracings are normal.     Physical Exam  Constitutional: She appears  well-developed.  HENT:  Head: Normocephalic.  Neck: Neck supple. No thyromegaly present.  Cardiovascular: Normal rate and regular rhythm.   Respiratory: Breath sounds normal.  GI: Soft. Bowel sounds are normal.  Skin: No rash noted.    Prenatal labs: ABO, Rh: O/POS/-- (09/03 1135) Antibody: NEG (09/03 1135) Rubella: 1.57 (09/03 1135) RPR: NON REACTIVE (03/22 2010)  HBsAg: NEGATIVE (09/03 1135)  HIV: NON REACTIVE (12/03 1633)  GBS: NEGATIVE (02/11 1041)   Assessment/Plan: Primipara w/an IUP @ 6837w1d.  For IOL secondary to post dates.  Category I FHT.  Admit Low dose Pitocin per protocol   JACKSON-MOORE,Beulah Capobianco A 03/17/2014, 11:54 PM

## 2014-03-18 ENCOUNTER — Encounter (HOSPITAL_COMMUNITY): Payer: Self-pay

## 2014-03-18 ENCOUNTER — Inpatient Hospital Stay (HOSPITAL_COMMUNITY): Payer: Medicaid Other | Admitting: Anesthesiology

## 2014-03-18 ENCOUNTER — Encounter (HOSPITAL_COMMUNITY): Payer: Medicaid Other | Admitting: Anesthesiology

## 2014-03-18 MED ORDER — DIPHENHYDRAMINE HCL 50 MG/ML IJ SOLN: 12.5000 mg | INTRAMUSCULAR | Status: DC | PRN

## 2014-03-18 MED ORDER — MEDROXYPROGESTERONE ACETATE 150 MG/ML IM SUSP: 150.0000 mg | INTRAMUSCULAR | Status: DC | PRN

## 2014-03-18 MED ORDER — LANOLIN HYDROUS EX OINT: TOPICAL_OINTMENT | CUTANEOUS | Status: DC | PRN

## 2014-03-18 MED ORDER — SENNOSIDES-DOCUSATE SODIUM 8.6-50 MG PO TABS
2.0000 | ORAL_TABLET | ORAL | Status: DC
  Administered 2014-03-18: 2 via ORAL
  Filled 2014-03-18: qty 2

## 2014-03-18 MED ORDER — METHYLERGONOVINE MALEATE 0.2 MG/ML IJ SOLN: 0.2000 mg | INTRAMUSCULAR | Status: DC | PRN

## 2014-03-18 MED ORDER — CEFAZOLIN SODIUM-DEXTROSE 2-3 GM-% IV SOLR
2.0000 g | Freq: Once | INTRAVENOUS | Status: AC
  Administered 2014-03-18: 2 g via INTRAVENOUS
  Filled 2014-03-18: qty 50

## 2014-03-18 MED ORDER — ONDANSETRON HCL 4 MG/2ML IJ SOLN
4.0000 mg | INTRAMUSCULAR | Status: DC | PRN
Start: 2014-03-18 — End: 2014-03-19

## 2014-03-18 MED ORDER — FENTANYL 2.5 MCG/ML BUPIVACAINE 1/10 % EPIDURAL INFUSION (WH - ANES)
14.0000 mL/h | INTRAMUSCULAR | Status: DC | PRN
  Filled 2014-03-18: qty 125

## 2014-03-18 MED ORDER — DIBUCAINE 1 % RE OINT: 1.0000 "application " | TOPICAL_OINTMENT | RECTAL | Status: DC | PRN

## 2014-03-18 MED ORDER — WITCH HAZEL-GLYCERIN EX PADS: 1.0000 "application " | MEDICATED_PAD | CUTANEOUS | Status: DC | PRN

## 2014-03-18 MED ORDER — ONDANSETRON HCL 4 MG PO TABS: 4.0000 mg | ORAL_TABLET | ORAL | Status: DC | PRN

## 2014-03-18 MED ORDER — PHENYLEPHRINE 40 MCG/ML (10ML) SYRINGE FOR IV PUSH (FOR BLOOD PRESSURE SUPPORT)
80.0000 ug | PREFILLED_SYRINGE | INTRAVENOUS | Status: DC | PRN
Start: 2014-03-18 — End: 2014-03-18
  Filled 2014-03-18: qty 2

## 2014-03-18 MED ORDER — TETANUS-DIPHTH-ACELL PERTUSSIS 5-2.5-18.5 LF-MCG/0.5 IM SUSP: 0.5000 mL | Freq: Once | INTRAMUSCULAR | Status: DC

## 2014-03-18 MED ORDER — EPHEDRINE 5 MG/ML INJ
10.0000 mg | INTRAVENOUS | Status: DC | PRN
  Filled 2014-03-18: qty 2
  Filled 2014-03-18: qty 4

## 2014-03-18 MED ORDER — FENTANYL 2.5 MCG/ML BUPIVACAINE 1/10 % EPIDURAL INFUSION (WH - ANES)
INTRAMUSCULAR | Status: DC | PRN
  Administered 2014-03-18: 14 mL/h via EPIDURAL

## 2014-03-18 MED ORDER — IBUPROFEN 600 MG PO TABS
600.0000 mg | ORAL_TABLET | Freq: Four times a day (QID) | ORAL | Status: DC
Start: 2014-03-18 — End: 2014-03-19
  Administered 2014-03-18 – 2014-03-19 (×6): 600 mg via ORAL
  Filled 2014-03-18 (×6): qty 1

## 2014-03-18 MED ORDER — ZOLPIDEM TARTRATE 5 MG PO TABS: 5.0000 mg | ORAL_TABLET | Freq: Every evening | ORAL | Status: DC | PRN

## 2014-03-18 MED ORDER — MAGNESIUM HYDROXIDE 400 MG/5ML PO SUSP: 30.0000 mL | ORAL | Status: DC | PRN

## 2014-03-18 MED ORDER — OXYCODONE-ACETAMINOPHEN 5-325 MG PO TABS: 1.0000 | ORAL_TABLET | ORAL | Status: DC | PRN

## 2014-03-18 MED ORDER — LACTATED RINGERS IV SOLN
500.0000 mL | Freq: Once | INTRAVENOUS | Status: AC
  Administered 2014-03-18: 500 mL via INTRAVENOUS

## 2014-03-18 MED ORDER — EPHEDRINE 5 MG/ML INJ
10.0000 mg | INTRAVENOUS | Status: DC | PRN
  Filled 2014-03-18: qty 2

## 2014-03-18 MED ORDER — METHYLERGONOVINE MALEATE 0.2 MG PO TABS: 0.2000 mg | ORAL_TABLET | ORAL | Status: DC | PRN

## 2014-03-18 MED ORDER — MEASLES, MUMPS & RUBELLA VAC ~~LOC~~ INJ: 0.5000 mL | INJECTION | Freq: Once | SUBCUTANEOUS | Status: DC

## 2014-03-18 MED ORDER — METHYLERGONOVINE MALEATE 0.2 MG/ML IJ SOLN
INTRAMUSCULAR | Status: AC
  Administered 2014-03-18: 0.2 mg
  Filled 2014-03-18: qty 1

## 2014-03-18 MED ORDER — BENZOCAINE-MENTHOL 20-0.5 % EX AERO: 1.0000 "application " | INHALATION_SPRAY | CUTANEOUS | Status: DC | PRN

## 2014-03-18 MED ORDER — PRENATAL MULTIVITAMIN CH
1.0000 | ORAL_TABLET | Freq: Every day | ORAL | Status: DC
  Administered 2014-03-18 – 2014-03-19 (×2): 1 via ORAL
  Filled 2014-03-18 (×2): qty 1

## 2014-03-18 MED ORDER — FERROUS SULFATE 325 (65 FE) MG PO TABS
325.0000 mg | ORAL_TABLET | Freq: Two times a day (BID) | ORAL | Status: DC
  Administered 2014-03-18 – 2014-03-19 (×3): 325 mg via ORAL
  Filled 2014-03-18 (×3): qty 1

## 2014-03-18 MED ORDER — PHENYLEPHRINE 40 MCG/ML (10ML) SYRINGE FOR IV PUSH (FOR BLOOD PRESSURE SUPPORT)
80.0000 ug | PREFILLED_SYRINGE | INTRAVENOUS | Status: DC | PRN
  Filled 2014-03-18: qty 10
  Filled 2014-03-18: qty 2

## 2014-03-18 MED ORDER — DIPHENHYDRAMINE HCL 25 MG PO CAPS: 25.0000 mg | ORAL_CAPSULE | Freq: Four times a day (QID) | ORAL | Status: DC | PRN

## 2014-03-18 MED ORDER — LIDOCAINE HCL (PF) 1 % IJ SOLN
INTRAMUSCULAR | Status: DC | PRN
  Administered 2014-03-18 (×3): 5 mL

## 2014-03-18 NOTE — Progress Notes (Signed)
UR completed 

## 2014-03-18 NOTE — Anesthesia Procedure Notes (Signed)
Epidural Patient location during procedure: OB  Staffing Anesthesiologist: Ajia Chadderdon Performed by: anesthesiologist   Preanesthetic Checklist Completed: patient identified, site marked, surgical consent, pre-op evaluation, timeout performed, IV checked, risks and benefits discussed and monitors and equipment checked  Epidural Patient position: sitting Prep: ChloraPrep Patient monitoring: heart rate, continuous pulse ox and blood pressure Approach: right paramedian Location: L3-L4 Injection technique: LOR saline  Needle:  Needle type: Tuohy  Needle gauge: 17 G Needle length: 9 cm and 9 Needle insertion depth: 6 cm Catheter type: closed end flexible Catheter size: 20 Guage Catheter at skin depth: 10 cm Test dose: negative  Assessment Events: blood not aspirated, injection not painful, no injection resistance, negative IV test and no paresthesia  Additional Notes   Patient tolerated the insertion well without complications.   

## 2014-03-18 NOTE — Anesthesia Preprocedure Evaluation (Signed)

## 2014-03-18 NOTE — Progress Notes (Signed)
Colleen Schmidt is Schmidt 28 y.o. G2P1001 at 481w2d by LMP admitted for induction of labor due to Post dates.   Subjective: C/O UCs.  Objective: BP 142/85  Pulse 106  Temp(Src) 98.3 F (36.8 C) (Oral)  Resp 18  Ht 5\' 2"  (1.575 m)  Wt 79.833 kg (176 lb)  BMI 32.18 kg/m2  SpO2 98%  LMP 06/02/2013      FHT:  FHR: 130 bpm, variability: moderate,  accelerations:  Present,  decelerations:  Absent UC:   regular, every 2 minutes SVE:   Dilation: 8 Effacement (%): 90 Station: 0 Exam by:: dr Tamela Oddijackson-moore AROM: Moderate meconium Labs: Lab Results  Component Value Date   WBC 8.2 03/17/2014   HGB 11.5* 03/17/2014   HCT 34.3* 03/17/2014   MCV 89.1 03/17/2014   PLT 175 03/17/2014    Assessment / Plan: Induction of labor due to postterm,  progressing well on pitocin  Labor: Progressing normally Preeclampsia:  n/Schmidt Fetal Wellbeing:  Category I Pain Control:  Fentanyl I/D:  n/Schmidt Anticipated MOD:  NSVD  Colleen Schmidt,Colleen Schmidt 03/18/2014, 7:07 AM

## 2014-03-18 NOTE — Anesthesia Postprocedure Evaluation (Signed)
Anesthesia Post Note  Patient: Colleen Schmidt  Procedure(s) Performed: * No procedures listed *  Anesthesia type: Epidural  Patient location: Mother/Baby  Post pain: Pain level controlled  Post assessment: Post-op Vital signs reviewed  Last Vitals:  Filed Vitals:   03/18/14 1100  BP: 140/86  Pulse: 70  Temp: 36.3 C  Resp: 16    Post vital signs: Reviewed  Level of consciousness:alert  Complications: No apparent anesthesia complications

## 2014-03-19 LAB — CBC
HEMATOCRIT: 29.4 % — AB (ref 36.0–46.0)
HEMOGLOBIN: 9.6 g/dL — AB (ref 12.0–15.0)
MCH: 29.2 pg (ref 26.0–34.0)
MCHC: 32.7 g/dL (ref 30.0–36.0)
MCV: 89.4 fL (ref 78.0–100.0)
Platelets: 153 10*3/uL (ref 150–400)
RBC: 3.29 MIL/uL — ABNORMAL LOW (ref 3.87–5.11)
RDW: 13.7 % (ref 11.5–15.5)
WBC: 11.1 10*3/uL — AB (ref 4.0–10.5)

## 2014-03-19 MED ORDER — IBUPROFEN 600 MG PO TABS: 600.0000 mg | ORAL_TABLET | Freq: Four times a day (QID) | ORAL | Status: DC

## 2014-03-19 NOTE — Discharge Summary (Signed)
Obstetric Discharge Summary Reason for Admission: induction of labor Prenatal Procedures: none Intrapartum Procedures: spontaneous vaginal delivery Postpartum Procedures: none Complications-Operative and Postpartum: vaginal laceration Hemoglobin  Date Value Ref Range Status  03/19/2014 9.6* 12.0 - 15.0 g/dL Final     HCT  Date Value Ref Range Status  03/19/2014 29.4* 36.0 - 46.0 % Final    Physical Exam:  General: alert, cooperative and appears stated age 40Lochia: appropriate Uterine Fundus: firm Incision: healing well DVT Evaluation: No evidence of DVT seen on physical exam.  Discharge Diagnoses: Term Pregnancy-delivered  Discharge Information: Date: 03/19/2014 Activity: pelvic rest Diet: routine Medications: PNV and Ibuprofen Condition: stable Instructions: refer to practice specific booklet Discharge to: home Encouraged high iron diet.   Newborn Data: Live born female  Birth Weight: 7 lb 8.1 oz (3405 g) APGAR: 8, 9  Home with mother.  Dola Lunsford 03/19/2014, 8:56 AM

## 2014-03-19 NOTE — Discharge Instructions (Signed)

## 2014-03-19 NOTE — Progress Notes (Signed)
Post Partum Day 1 Subjective: no complaints  Objective: Blood pressure 114/56, pulse 67, temperature 97.6 F (36.4 C), temperature source Oral, resp. rate 17, height 5\' 2"  (1.575 m), weight 176 lb (79.833 kg), last menstrual period 06/02/2013, SpO2 98.00%, unknown if currently breastfeeding.  Physical Exam:  General: alert and no distress Lochia: appropriate Uterine Fundus: firm Incision: none DVT Evaluation: No evidence of DVT seen on physical exam.   Recent Labs  03/17/14 2010 03/19/14 0633  HGB 11.5* 9.6*  HCT 34.3* 29.4*    Assessment/Plan: Plan for discharge tomorrow   LOS: 2 days   Yaqub Arney A 03/19/2014, 8:49 AM

## 2014-03-19 NOTE — Lactation Note (Signed)
This note was copied from the chart of Colleen Schmidt. Lactation Consultation Note  Mom awake, stating BF going well, having no difficulty or issues. Denies soreness or need for LC at this moment. Discussed her dx; of anemia issues and importance of high protein diet. Discussed having a dietician come and talk to her. Mom stated that she had one during her pregnancy. Mentioned foods high in proteins and eating greens, Mom stated she knows about these foods. Felt that things were going well. WH/LC brochure given w/resources, support groups and LC services.Encouraged to call for assistance if needed and to verify proper latch. Patient Name: Colleen Schmidt ZHYQM'VToday's Date: 03/19/2014     Maternal Data    Feeding Feeding Type: Breast Fed Length of feed: 8 min  LATCH Score/Interventions                      Lactation Tools Discussed/Used     Consult Status      Colleen Schmidt 03/19/2014, 2:04 AM

## 2014-03-19 NOTE — Progress Notes (Signed)
Voided approx 350 during night/bladder scan 254 cc remained in bladder   Bleeding small   Uterus one below and firm

## 2014-03-20 ENCOUNTER — Ambulatory Visit: Payer: Self-pay

## 2014-03-20 NOTE — Lactation Note (Signed)
This note was copied from the chart of Colleen Schmidt. Lactation Consultation Note  Patient Name: Colleen Schmidt ZOXWR'UToday's Date: 03/20/2014 Reason for consult: Follow-up assessment;Breast/nipple pain Mom reports some mild nipple tenderness. Assisted Mom with positioning and obtaining more depth with latch. Baby demonstrates a good rhythmic suck, Mom reports decrease discomfort. Care for sore nipples reviewed. Comfort gels given with instructions. Engorgement care reviewed if needed. Hand pump demonstrated to Mom. Advised of OP services and support group.   Maternal Data    Feeding Feeding Type: Breast Fed Length of feed: 15 min  LATCH Score/Interventions Latch: Grasps breast easily, tongue down, lips flanged, rhythmical sucking.  Audible Swallowing: A few with stimulation  Type of Nipple: Everted at rest and after stimulation  Comfort (Breast/Nipple): Filling, red/small blisters or bruises, mild/mod discomfort  Problem noted: Mild/Moderate discomfort Interventions (Mild/moderate discomfort): Comfort gels (EBM to sore nipples)  Hold (Positioning): Assistance needed to correctly position infant at breast and maintain latch.  LATCH Score: 7  Lactation Tools Discussed/Used Tools: Comfort gels   Consult Status Consult Status: Complete    Alfred LevinsGranger, Keyen Marban Ann 03/20/2014, 10:23 AM

## 2014-04-25 ENCOUNTER — Ambulatory Visit: Payer: Medicaid Other | Admitting: Obstetrics & Gynecology

## 2014-04-26 ENCOUNTER — Ambulatory Visit (INDEPENDENT_AMBULATORY_CARE_PROVIDER_SITE_OTHER): Payer: Medicaid Other | Admitting: Obstetrics & Gynecology

## 2014-04-26 ENCOUNTER — Encounter: Payer: Self-pay | Admitting: Obstetrics & Gynecology

## 2014-04-26 NOTE — Progress Notes (Signed)
Subjective:     Colleen Schmidt is a 28 y.o. female who presents for a postpartum visit. She is 6 weeks postpartum following a spontaneous vaginal delivery. I have fully reviewed the prenatal and intrapartum course. The delivery was at 41 gestational weeks. Outcome: spontaneous vaginal delivery. Anesthesia: epidural. Postpartum course has been uneventful. Baby's course has been uncomplicated. Baby is feeding by breast. Bleeding no bleeding. Bowel function is normal. Bladder function is normal. Patient is not sexually active. Contraception method is abstinence. Postpartum depression screening: negative.  The following portions of the patient's history were reviewed and updated as appropriate: allergies, current medications, past family history, past medical history, past social history, past surgical history and problem list.  Review of Systems Pertinent items are noted in HPI.   Objective:    BP 121/84  Pulse 60  Temp(Src) 98.3 F (36.8 C)  Wt 71.215 kg (157 lb)  Breastfeeding? Yes        General:   alert  Skin:   no rash or abnormalities  Lungs:   clear to auscultation bilaterally  Heart:   regular rate and rhythm, S1, S2 normal, no murmur, click, rub or gallop  Breasts:   normal without suspicious masses, skin or nipple changes or axillary nodes  Abdomen:  normal findings: no organomegaly, soft, non-tender and no hernia  Pelvis:  External genitalia: normal general appearance Urinary system: urethral meatus normal and bladder without fullness, nontender Vaginal: normal without tenderness, induration or masses Cervix: normal appearance Adnexa: normal bimanual exam Uterus: anteverted and non-tender, normal size     Assessment:     Normal postpartum exam.   Plan:     Contraception: abstinence Return for colposcopy

## 2014-04-26 NOTE — Patient Instructions (Signed)
Colposcopy  Colposcopy is a procedure to examine your cervix and vagina, or the area around the outside of your vagina, for abnormalities or signs of disease. The procedure is done using a lighted microscope called a colposcope. Tissue samples may be collected during the colposcopy if your health care provider finds any unusual cells. A colposcopy may be done if a woman has:   An abnormal Pap test. A Pap test is a medical test done to evaluate cells that are on the surface of the cervix.   A Pap test result that is suggestive of human papillomavirus (HPV). This virus can cause genital warts and is linked to the development of cervical cancer.   A sore on her cervix and the results of a Pap test were normal.   Genital warts on the cervix or in or around the outside of the vagina.   A mother who took the drug diethylstilbestrol (DES) while pregnant.   Painful intercourse.   Vaginal bleeding, especially after sexual intercourse.  LET YOUR HEALTH CARE PROVIDER KNOW ABOUT:   Any allergies you have.   All medicines you are taking, including vitamins, herbs, eye drops, creams, and over-the-counter medicines.   Previous problems you or members of your family have had with the use of anesthetics.   Any blood disorders you have.   Previous surgeries you have had.   Medical conditions you have.  RISKS AND COMPLICATIONS  Generally, a colposcopy is a safe procedure. However, as with any procedure, complications can occur. Possible complications include:   Bleeding.   Infection.   Missed lesions.  BEFORE THE PROCEDURE    Tell your health care provider if you have your menstrual period. A colposcopy typically is not done during menstruation.   For 24 hours before the colposcopy, do not:   Douche.   Use tampons.   Use medicines, creams, or suppositories in the vagina.   Have sexual intercourse.  PROCEDURE   During the procedure, you will be lying on your back with your feet in foot rests (stirrups). A warm  metal or plastic instrument (speculum) will be placed in your vagina to keep it open and to allow the health care provider to see the cervix. The colposcope will be placed outside the vagina. It will be used to magnify and examine the cervix, vagina, and the area around the outside of the vagina. A small amount of liquid solution will be placed on the area that is to be viewed. This solution will make it easier to see the abnormal cells. Your health care provider will use tools to suck out mucus and cells from the canal of the cervix. Then he or she will record the location of the abnormal areas.  If a biopsy is done during the procedure, a medicine will usually be given to numb the area (local anesthetic). You may feel mild pain or cramping while the biopsy is done. After the procedure, tissue samples collected during the biopsy will be sent to a lab for analysis.  AFTER THE PROCEDURE   You will be given instructions on when to follow up with your health care provider for your test results. It is important to keep your appointment.  Document Released: 03/05/2003 Document Revised: 08/15/2013 Document Reviewed: 07/12/2013  [employer] Patient Information 2014 [employer]

## 2014-05-12 NOTE — Patient Instructions (Signed)
Fetal Movement Counts Patient Name: __________________________________________________ Patient Due Date: ____________________ Performing a fetal movement count is highly recommended in high-risk pregnancies, but it is good for every pregnant woman to do. Your caregiver may ask you to start counting fetal movements at 28 weeks of the pregnancy. Fetal movements often increase:  After eating a full meal.  After physical activity.  After eating or drinking something sweet or cold.  At rest. Pay attention to when you feel the baby is most active. This will help you notice a pattern of your baby's sleep and wake cycles and what factors contribute to an increase in fetal movement. It is important to perform a fetal movement count at the same time each day when your baby is normally most active.  HOW TO COUNT FETAL MOVEMENTS 1. Find a quiet and comfortable area to sit or lie down on your left side. Lying on your left side provides the best blood and oxygen circulation to your baby. 2. Write down the day and time on a sheet of paper or in a journal. 3. Start counting kicks, flutters, swishes, rolls, or jabs in a 2 hour period. You should feel at least 10 movements within 2 hours. 4. If you do not feel 10 movements in 2 hours, wait 2 3 hours and count again. Look for a change in the pattern or not enough counts in 2 hours. SEEK MEDICAL CARE IF:  You feel less than 10 counts in 2 hours, tried twice.  There is no movement in over an hour.  The pattern is changing or taking longer each day to reach 10 counts in 2 hours.  You feel the baby is not moving as he or she usually does. Date: ____________ Movements: ____________ Start time: ____________ Finish time: ____________  Date: ____________ Movements: ____________ Start time: ____________ Finish time: ____________ Date: ____________ Movements: ____________ Start time: ____________ Finish time: ____________ Date: ____________ Movements: ____________  Start time: ____________ Finish time: ____________ Date: ____________ Movements: ____________ Start time: ____________ Finish time: ____________ Date: ____________ Movements: ____________ Start time: ____________ Finish time: ____________ Date: ____________ Movements: ____________ Start time: ____________ Finish time: ____________ Date: ____________ Movements: ____________ Start time: ____________ Finish time: ____________  Date: ____________ Movements: ____________ Start time: ____________ Finish time: ____________ Date: ____________ Movements: ____________ Start time: ____________ Finish time: ____________ Date: ____________ Movements: ____________ Start time: ____________ Finish time: ____________ Date: ____________ Movements: ____________ Start time: ____________ Finish time: ____________ Date: ____________ Movements: ____________ Start time: ____________ Finish time: ____________ Date: ____________ Movements: ____________ Start time: ____________ Finish time: ____________ Date: ____________ Movements: ____________ Start time: ____________ Finish time: ____________  Date: ____________ Movements: ____________ Start time: ____________ Finish time: ____________ Date: ____________ Movements: ____________ Start time: ____________ Finish time: ____________ Date: ____________ Movements: ____________ Start time: ____________ Finish time: ____________ Date: ____________ Movements: ____________ Start time: ____________ Finish time: ____________ Date: ____________ Movements: ____________ Start time: ____________ Finish time: ____________ Date: ____________ Movements: ____________ Start time: ____________ Finish time: ____________ Date: ____________ Movements: ____________ Start time: ____________ Finish time: ____________  Date: ____________ Movements: ____________ Start time: ____________ Finish time: ____________ Date: ____________ Movements: ____________ Start time: ____________ Finish time:  ____________ Date: ____________ Movements: ____________ Start time: ____________ Finish time: ____________ Date: ____________ Movements: ____________ Start time: ____________ Finish time: ____________ Date: ____________ Movements: ____________ Start time: ____________ Finish time: ____________ Date: ____________ Movements: ____________ Start time: ____________ Finish time: ____________ Date: ____________ Movements: ____________ Start time: ____________ Finish time: ____________  Date: ____________ Movements: ____________ Start time: ____________ Finish   time: ____________ Date: ____________ Movements: ____________ Start time: ____________ Finish time: ____________ Date: ____________ Movements: ____________ Start time: ____________ Finish time: ____________ Date: ____________ Movements: ____________ Start time: ____________ Finish time: ____________ Date: ____________ Movements: ____________ Start time: ____________ Finish time: ____________ Date: ____________ Movements: ____________ Start time: ____________ Finish time: ____________ Date: ____________ Movements: ____________ Start time: ____________ Finish time: ____________  Date: ____________ Movements: ____________ Start time: ____________ Finish time: ____________ Date: ____________ Movements: ____________ Start time: ____________ Finish time: ____________ Date: ____________ Movements: ____________ Start time: ____________ Finish time: ____________ Date: ____________ Movements: ____________ Start time: ____________ Finish time: ____________ Date: ____________ Movements: ____________ Start time: ____________ Finish time: ____________ Date: ____________ Movements: ____________ Start time: ____________ Finish time: ____________ Date: ____________ Movements: ____________ Start time: ____________ Finish time: ____________  Date: ____________ Movements: ____________ Start time: ____________ Finish time: ____________ Date: ____________ Movements:  ____________ Start time: ____________ Finish time: ____________ Date: ____________ Movements: ____________ Start time: ____________ Finish time: ____________ Date: ____________ Movements: ____________ Start time: ____________ Finish time: ____________ Date: ____________ Movements: ____________ Start time: ____________ Finish time: ____________ Date: ____________ Movements: ____________ Start time: ____________ Finish time: ____________ Date: ____________ Movements: ____________ Start time: ____________ Finish time: ____________  Date: ____________ Movements: ____________ Start time: ____________ Finish time: ____________ Date: ____________ Movements: ____________ Start time: ____________ Finish time: ____________ Date: ____________ Movements: ____________ Start time: ____________ Finish time: ____________ Date: ____________ Movements: ____________ Start time: ____________ Finish time: ____________ Date: ____________ Movements: ____________ Start time: ____________ Finish time: ____________ Date: ____________ Movements: ____________ Start time: ____________ Finish time: ____________ Document Released: 01/12/2007 Document Revised: 11/29/2012 Document Reviewed: 10/09/2012 [employer] Patient Information 2014 [employer].  

## 2014-06-03 ENCOUNTER — Other Ambulatory Visit: Payer: Self-pay | Admitting: Obstetrics and Gynecology

## 2014-06-03 ENCOUNTER — Ambulatory Visit (INDEPENDENT_AMBULATORY_CARE_PROVIDER_SITE_OTHER): Payer: Medicaid Other | Admitting: Obstetrics & Gynecology

## 2014-06-03 ENCOUNTER — Encounter: Payer: Self-pay | Admitting: Obstetrics & Gynecology

## 2014-06-03 VITALS — BP 113/82 | HR 81 | Temp 97.5°F | Ht 62.0 in | Wt 158.0 lb

## 2014-06-03 DIAGNOSIS — Z01812 Encounter for preprocedural laboratory examination: Secondary | ICD-10-CM

## 2014-06-03 DIAGNOSIS — Z3202 Encounter for pregnancy test, result negative: Secondary | ICD-10-CM

## 2014-06-03 DIAGNOSIS — R87612 Low grade squamous intraepithelial lesion on cytologic smear of cervix (LGSIL): Secondary | ICD-10-CM

## 2014-06-03 LAB — POCT URINE PREGNANCY: Preg Test, Ur: NEGATIVE

## 2014-06-03 NOTE — Progress Notes (Signed)
Colposcopy Procedure Note  Indications: Pap smear 8 months ago showed: low-grade squamous intraepithelial neoplasia (LGSIL - encompassing HPV,mild dysplasia,CIN I). The prior pap showed no abnormalities.  Prior cervical/vaginal disease:none.  Procedure Details  The risks and benefits of the procedure and Written informed consent obtained.  A time-out was performed confirming the patient, procedure and allergy status  Speculum placed in vagina and excellent visualization of cervix achieved, cervix swabbed x 3 with acetic acid solution.  Findings: Cervix: acetowhite lesion(s) noted at 5 o'clock and punctation noted at 11 o'clock; cervical biopsies taken at 11, 5 o'clock.      Physical Exam  Genitourinary:       Specimens: ECC, cervical biopsies  Complications: none.  Plan: Specimens labelled and sent to Pathology. Will base further treatment on Pathology findings.

## 2014-06-03 NOTE — Patient Instructions (Signed)
COLPOSCOPY POST-PROCEDURE INSTRUCTIONS  1. You may take Ibuprofen, Aleve or Tylenol for cramping if needed.  2. If Monsel's solution was used, you will have a black discharge.  3. Light bleeding is normal.  If bleeding is heavier than your period, please call.  4. Put nothing in your vagina until the bleeding or discharge stops (usually 2 or 3 days).  5. We will call you within one week with biopsy results or discuss the results at your follow-up appointment if needed.  

## 2014-06-17 ENCOUNTER — Encounter: Payer: Self-pay | Admitting: Obstetrics & Gynecology

## 2014-06-17 DIAGNOSIS — N870 Mild cervical dysplasia: Secondary | ICD-10-CM | POA: Insufficient documentation

## 2014-06-19 ENCOUNTER — Telehealth: Payer: Self-pay | Admitting: *Deleted

## 2014-06-19 NOTE — Telephone Encounter (Signed)
Patient call requesting lab results. Informed patient would have Dr. Clearance CootsHarper review results. Patient notified would cal back with results and follow up.

## 2014-06-25 ENCOUNTER — Telehealth: Payer: Self-pay | Admitting: Obstetrics & Gynecology

## 2014-06-25 NOTE — Telephone Encounter (Signed)
[id number] - 12:46 pm - brm - called patient per Erskine SquibbJane and advised her all the Pathology reort weer benign and ok to repeat PAP/HPV in 1 year. Patient stated she would call and schedule at a later date.

## 2014-07-17 NOTE — Telephone Encounter (Signed)
Pt was notified by [facility] regarding lab results and may follow up with pap next year.

## 2014-09-26 ENCOUNTER — Encounter (HOSPITAL_COMMUNITY): Payer: Self-pay | Admitting: Emergency Medicine

## 2014-09-26 ENCOUNTER — Emergency Department (HOSPITAL_COMMUNITY)
Admission: EM | Admit: 2014-09-26 | Discharge: 2014-09-26 | Disposition: A | Discharge status: Home / Self Care | Payer: Medicaid Other | Attending: Emergency Medicine | Admitting: Emergency Medicine

## 2014-09-26 DIAGNOSIS — Z9889 Other specified postprocedural states: Secondary | ICD-10-CM | POA: Insufficient documentation

## 2014-09-26 DIAGNOSIS — Z87898 Personal history of other specified conditions: Secondary | ICD-10-CM | POA: Insufficient documentation

## 2014-09-26 DIAGNOSIS — Y9389 Activity, other specified: Secondary | ICD-10-CM | POA: Insufficient documentation

## 2014-09-26 DIAGNOSIS — M545 Low back pain, unspecified: Secondary | ICD-10-CM

## 2014-09-26 DIAGNOSIS — Z79899 Other long term (current) drug therapy: Secondary | ICD-10-CM | POA: Insufficient documentation

## 2014-09-26 DIAGNOSIS — S3982XA Other specified injuries of lower back, initial encounter: Secondary | ICD-10-CM | POA: Insufficient documentation

## 2014-09-26 DIAGNOSIS — Z8619 Personal history of other infectious and parasitic diseases: Secondary | ICD-10-CM | POA: Insufficient documentation

## 2014-09-26 DIAGNOSIS — Y92410 Unspecified street and highway as the place of occurrence of the external cause: Secondary | ICD-10-CM | POA: Insufficient documentation

## 2014-09-26 MED ORDER — METHOCARBAMOL 500 MG PO TABS: 500.0000 mg | ORAL_TABLET | Freq: Two times a day (BID) | ORAL | Status: DC | PRN
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
## 2014-09-30 NOTE — ED Provider Notes (Signed)
Medical screening examination/treatment/procedure(s) were performed by non-physician practitioner and as supervising physician I was immediately available for consultation/collaboration.   EKG Interpretation None        Nicholson Starace J. Zachrey Deutscher, MD 09/30/14 0659 

## 2014-10-28 ENCOUNTER — Encounter (HOSPITAL_COMMUNITY): Payer: Self-pay | Admitting: Emergency Medicine

## 2014-12-23 ENCOUNTER — Encounter: Payer: Self-pay | Admitting: *Deleted

## 2014-12-24 ENCOUNTER — Encounter: Payer: Self-pay | Admitting: Obstetrics & Gynecology

## 2016-11-07 ENCOUNTER — Encounter (HOSPITAL_COMMUNITY): Payer: Self-pay

## 2016-11-07 ENCOUNTER — Emergency Department (HOSPITAL_COMMUNITY)
Admission: EM | Admit: 2016-11-07 | Discharge: 2016-11-07 | Disposition: A | Discharge status: Home / Self Care | Payer: Medicaid Other | Attending: Emergency Medicine | Admitting: Emergency Medicine

## 2016-11-07 DIAGNOSIS — L235 Allergic contact dermatitis due to other chemical products: Secondary | ICD-10-CM | POA: Insufficient documentation

## 2016-11-07 MED ORDER — PREDNISONE 20 MG PO TABS: ORAL_TABLET | ORAL | 0 refills | Status: DC

## 2016-11-07 NOTE — Discharge Instructions (Signed)
SEEK IMMEDIATE MEDICAL CARE IF:  You have a severe headache, neck pain, or neck stiffness. You vomit. You feel very sleepy. You notice red streaks coming from the affected area. Your bone or joint underneath the affected area becomes painful after the skin has healed. The affected area turns darker. You have difficulty breathing.

## 2016-11-07 NOTE — ED Notes (Signed)
ITCHING RASH TO FACE,NECK AND LEFT ANTERIOR WRIST X 3 DAYS, SINCE GETTING HAIR COLORED.

## 2016-11-07 NOTE — ED Triage Notes (Signed)
Patient complains of facial and neck rash that started Thursday after using hair color on Wednesday, complains of ongoing itching following benadryl use, NAD

## 2016-11-07 NOTE — ED Provider Notes (Signed)
[facility]-EMERGENCY DEPT Provider Note    By signing my name below, I, Earmon PhoenixJennifer Waddell, attest that this documentation has been prepared under the direction and in the presence of Arthor CaptainAbigail Jaquise Faux, PA-C. Electronically Signed: Earmon PhoenixJennifer Waddell, ED Scribe. 11/07/16. 10:28 AM.   History   Chief Complaint Chief Complaint  Patient presents with  . facial rash    The history is provided by the patient and medical records. No language interpreter was used.    HPI Comments:  Colleen Schmidt is a 30 y.o. female who presents to the Emergency Department complaining of an itching rash to her face that appeared two days ago. She reports coloring her hair with a different brand of hair dye three days ago and and noticed some forehead swelling and a rash on the face where her hair touched it the next day. She also reports some itching to the left wrist where the skin came into contact with the dye. She has been taking oatmeal baths with no significant relief of the symptoms. She denies modifying factors. She denies fever, chills, nausea, vomiting, difficulty breathing or swallowing.   Past Medical History:  Diagnosis Date  . Medical history non-contributory   . Pregnant   . Trichomonas     Patient Active Problem List   Diagnosis Date Noted  . CIN I (cervical intraepithelial neoplasia I) 06/17/2014  . Low grade squamous intraepithelial lesion (LGSIL) on Papanicolaou smear of cervix 06/03/2014  . Normocytic anemia 12/09/2013    Past Surgical History:  Procedure Laterality Date  . WISDOM TOOTH EXTRACTION      OB History    Gravida Para Term Preterm AB Living   2 2 2     2    SAB TAB Ectopic Multiple Live Births           2       Home Medications    Prior to Admission medications   Medication Sig Start Date End Date Taking? Authorizing Provider  methocarbamol (ROBAXIN) 500 MG tablet Take 1 tablet (500 mg total) by mouth 2 (two) times daily as needed for muscle spasms. 09/26/14    Joycie PeekBenjamin Cartner, PA-C  predniSONE (DELTASONE) 20 MG tablet 3 tabs po daily x 3 days, then 2 tabs x 3 days, then 1.5 tabs x 3 days, then 1 tab x 3 days, then 0.5 tabs x 3 days 11/07/16   Arthor CaptainAbigail Jena Tegeler, PA-C    Family History Family History  Problem Relation Age of Onset  . Hypertension Mother   . Cancer Maternal Grandmother     lung  . Hypertension Paternal Grandmother   . Cancer Paternal Grandfather     Social History Social History  Substance Use Topics  . Smoking status: Never Smoker  . Smokeless tobacco: Never Used  . Alcohol use Yes     Comment: Social      Allergies   Patient has no known allergies.   Review of Systems Review of Systems  Constitutional: Negative for chills and fever.  HENT: Negative for trouble swallowing.   Respiratory: Negative for shortness of breath.   Gastrointestinal: Negative for nausea and vomiting.  Skin: Positive for rash.     Physical Exam Updated Vital Signs BP 121/82 (BP Location: Left Arm)   Pulse 106   Temp 98.2 F (36.8 C) (Oral)   Resp 20   Wt 153 lb (69.4 kg)   SpO2 99%   BMI 27.98 kg/m   Physical Exam  Constitutional: She is oriented to person, place, and  time. She appears well-developed and well-nourished.  HENT:  Head: Normocephalic and atraumatic.  Along hairline has alopecia secondary to chemical dermatitis. Hives and multiple singular and confluent erythematous papules on face and forehead, neck and left wrist.  Neck: Normal range of motion.  Cardiovascular: Normal rate.   Pulmonary/Chest: Effort normal.  Musculoskeletal: Normal range of motion.  Neurological: She is alert and oriented to person, place, and time.  Skin: Skin is warm and dry.  Psychiatric: She has a normal mood and affect. Her behavior is normal.  Nursing note and vitals reviewed.    ED Treatments / Results  DIAGNOSTIC STUDIES: Oxygen Saturation is 99% on RA, normal by my interpretation.   COORDINATION OF CARE: 10:26 AM- Will  prescribe Prednisone. Pt verbalizes understanding and agrees to plan.  Medications - No data to display  Labs (all labs ordered are listed, but only abnormal results are displayed) Labs Reviewed - No data to display  EKG  EKG Interpretation None       Radiology No results found.  Procedures Procedures (including critical care time)  Medications Ordered in ED Medications - No data to display   Initial Impression / Assessment and Plan / ED Course  I have reviewed the triage vital signs and the nursing notes.  Pertinent labs & imaging results that were available during my care of the patient were reviewed by me and considered in my medical decision making (see chart for details).  Clinical Course     Patient with contact dermatitis. Instructed to avoid offending agent and to use unscented soaps, lotions, and detergents. Will treat with Prednisone.  No signs of secondary infection. Follow up with PCP in 2-3 days. Return precautions discussed. Pt is safe for discharge at this time.  I personally performed the services described in this documentation, which was scribed in my presence. The recorded information has been reviewed and is accurate.     Final Clinical Impressions(s) / ED Diagnoses   Final diagnoses:  Chemical induced allergic contact dermatitis    New Prescriptions New Prescriptions   PREDNISONE (DELTASONE) 20 MG TABLET    3 tabs po daily x 3 days, then 2 tabs x 3 days, then 1.5 tabs x 3 days, then 1 tab x 3 days, then 0.5 tabs x 3 days     Arthor CaptainAbigail Gwynevere Lizana, PA-C 11/07/16 1031    Vanetta MuldersScott Zackowski, MD 11/08/16 1706

## 2017-12-29 ENCOUNTER — Encounter (HOSPITAL_COMMUNITY): Payer: Self-pay

## 2017-12-29 ENCOUNTER — Ambulatory Visit (HOSPITAL_COMMUNITY)
Admission: RE | Admit: 2017-12-29 | Discharge: 2017-12-29 | Disposition: A | Discharge status: Home / Self Care | Payer: Self-pay | Source: Ambulatory Visit | Attending: Obstetrics and Gynecology | Admitting: Obstetrics and Gynecology

## 2017-12-29 VITALS — BP 110/68 | Temp 98.5°F | Ht 62.0 in | Wt 153.0 lb

## 2017-12-29 DIAGNOSIS — Z01419 Encounter for gynecological examination (general) (routine) without abnormal findings: Secondary | ICD-10-CM

## 2017-12-29 NOTE — Addendum Note (Signed)
Encounter addended by: Priscille HeidelbergBrannock, Angelly Spearing P, RN on: 12/29/2017 10:17 AM  Actions taken: Sign clinical note

## 2017-12-29 NOTE — Patient Instructions (Signed)
Explained breast self awareness with Colleen Schmidt. Let patient know that if today's Pap smear is normal that her next one will be due in one year due to her last Pap smear was abnormal. Let patient know will follow up with her within the next couple weeks with results of Pap smear by letter or phone. Colleen Schmidt verbalized understanding.  [name], [name], RN 8:28 AM

## 2017-12-29 NOTE — Addendum Note (Signed)
Encounter addended by: Lynnell DikeHolland, Stoney Karczewski H, LPN on: 4/0/98111/02/2018 10:18 AM  Actions taken: Order list changed

## 2017-12-29 NOTE — Progress Notes (Addendum)
No complaints today.   Pap Smear: Pap smear completed today. [facility] and LGS[facility]. Patient had a colposcopy to follow-up for abnormal Pap smear that showed CIN-I. Per patient her last Pap smear is the only abnormal Pap smear she has had. Last Pap smear and colposcopy results are in Epic.  Physical exam: Breasts Breasts symmetrical. No skin abnormalities bilateral breasts. No nipple retraction bilateral breasts. No nipple discharge bilateral breasts. No lymphadenopathy. No lumps palpated bilateral breasts. No complaints of pain or tenderness on exam. Screening mammogram recommended at age 30 unless clinically indicated prior.  Pelvic/Bimanual   Ext Genitalia No lesions, no swelling and no discharge observed on external genitalia. Skin discoloration observed in vaginal area. [facility].    Vagina Vagina pink and normal texture. No lesions or discharge observed in vagina.          Cervix Cervix is present. Cervix pink and of normal texture. No discharge observed.     Uterus Uterus is present and palpable. Uterus in normal position and normal size.        Adnexae Bilateral ovaries present and palpable. No tenderness on palpation.          Rectovaginal No rectal exam completed today since patient had no rectal complaints. No skin abnormalities observed on exam.    Smoking History: Patient has never smoked.  Patient Navigation: Patient education provided. [facility].

## 2017-12-30 ENCOUNTER — Encounter (HOSPITAL_COMMUNITY): Payer: Self-pay | Admitting: *Deleted

## 2017-12-30 LAB — CYTOLOGY - PAP
Diagnosis: NEGATIVE
HPV (WINDOPATH): NOT DETECTED

## 2018-01-03 ENCOUNTER — Ambulatory Visit (INDEPENDENT_AMBULATORY_CARE_PROVIDER_SITE_OTHER): Payer: Self-pay | Admitting: Obstetrics and Gynecology

## 2018-01-03 ENCOUNTER — Encounter: Payer: Self-pay | Admitting: Obstetrics and Gynecology

## 2018-01-03 VITALS — BP 123/77 | HR 77 | Wt 153.0 lb

## 2018-01-03 DIAGNOSIS — L80 Vitiligo: Secondary | ICD-10-CM

## 2018-01-03 LAB — POCT PREGNANCY, URINE: Preg Test, Ur: NEGATIVE

## 2018-01-03 NOTE — Progress Notes (Signed)
Patient ID: Colleen Schmidt, female   DOB: 06/14/1986, 32 y.o.   MRN: [id number] [name] presents for evaluation of discoloration of external genitalia. Has noted for 3 yrs now. Has progressed in that time period. Occ itching. No pain Not sexual active  Last pap smear with [facility]   PE AF VSS Lungs clear Heart RRR And soft + BS GU areas of hypopigmentation noted on labial bilateral and perineal body, non tender vaginal mucosa pink and well rugated, cervix no lesions   A/P Vitiligo  Do not think this is Lichen sclerosis or Planus d/t no severe itching or vaginal involvement. Discussed punch Bx for definite DX.  Pt declined at present. Instructed to return to clinic if desires or change in her Sx particularly intense itching.

## 2018-01-03 NOTE — Progress Notes (Signed)
Pt stated having discoloration/itching outside part of the vagina for about 3 years.

## 2018-01-25 ENCOUNTER — Encounter (HOSPITAL_COMMUNITY): Payer: Self-pay | Admitting: *Deleted

## 2018-01-25 NOTE — Progress Notes (Signed)
Letter mailed to patient with negative pap smear results.  

## 2018-05-22 ENCOUNTER — Encounter (HOSPITAL_COMMUNITY): Payer: Self-pay | Admitting: Emergency Medicine

## 2018-05-22 ENCOUNTER — Emergency Department (HOSPITAL_COMMUNITY)
Admission: EM | Admit: 2018-05-22 | Discharge: 2018-05-22 | Disposition: A | Discharge status: Home / Self Care | Payer: Self-pay | Attending: Emergency Medicine | Admitting: Emergency Medicine

## 2018-05-22 DIAGNOSIS — L03012 Cellulitis of left finger: Secondary | ICD-10-CM | POA: Insufficient documentation

## 2018-05-22 LAB — POC URINE PREG, ED: PREG TEST UR: NEGATIVE

## 2018-05-22 MED ORDER — LIDOCAINE HCL (PF) 1 % IJ SOLN
10.0000 mL | Freq: Once | INTRAMUSCULAR | Status: AC
  Administered 2018-05-22: 10 mL
  Filled 2018-05-22: qty 30

## 2018-05-22 MED ORDER — CHLORHEXIDINE GLUCONATE 2 % EX SOLN: 10.0000 mL | Freq: Every day | CUTANEOUS | 0 refills | Status: AC

## 2018-05-22 NOTE — ED Provider Notes (Signed)
[facility]-EMERGENCY DEPT Provider Note   CSN: [id number] Arrival date & time: 05/22/18  1191     History   Chief Complaint Chief Complaint  Patient presents with  . Hand Pain    HPI Colleen Schmidt is a 32 y.o. female.  HPI  Patient is a 32 year old female with  no significant past medical history presenting for pain to the distal left middle finger.  Patient reports that she noticed an approximately 5 days ago.  Patient reports that she noted some swelling to the ulnar aspect of the nailbed of the left middle finger.  Patient reports that it caused her entire hand to ache.  Patient denies any difficulty with range of motion of the finger, or fusiform swelling of the finger.  Patient denies any fever or chills.  No history of immune compromising conditions.  Patient does work as a Interior and spatial designer.  Patient denies any treatments prior to arrival.  Past Medical History:  Diagnosis Date  . Medical history non-contributory   . Pregnant   . Trichomonas     Patient Active Problem List   Diagnosis Date Noted  . Vitiligo 01/03/2018  . CIN I (cervical intraepithelial neoplasia I) 06/17/2014  . Low grade squamous intraepithelial lesion (LGSIL) on Papanicolaou smear of cervix 06/03/2014  . Normocytic anemia 12/09/2013    Past Surgical History:  Procedure Laterality Date  . WISDOM TOOTH EXTRACTION       OB History    Gravida  2   Para  2   Term  2   Preterm      AB      Living  2     SAB      TAB      Ectopic      Multiple      Live Births  2            Home Medications    Prior to Admission medications   Not on File    Family History Family History  Problem Relation Age of Onset  . Hypertension Mother   . Cancer Maternal Grandmother        lung  . Breast cancer Maternal Grandmother   . Hypertension Paternal Grandmother   . Cancer Paternal Grandfather     Social History Social History   Tobacco Use  . Smoking  status: Never Smoker  . Smokeless tobacco: Never Used  Substance Use Topics  . Alcohol use: No    Frequency: Never  . Drug use: No     Allergies   Patient has no known allergies.   Review of Systems Review of Systems  Constitutional: Negative for chills and fever.  Musculoskeletal: Positive for arthralgias and joint swelling.  Skin: Positive for color change.  Neurological: Negative for weakness and numbness.     Physical Exam Updated Vital Signs BP 126/88 (BP Location: Right Arm)   Pulse 71   Temp 98 F (36.7 C) (Oral)   Resp 18   SpO2 100%   Physical Exam  Constitutional: She appears well-developed and well-nourished. No distress.  Sitting comfortably in bed.  HENT:  Head: Normocephalic and atraumatic.  Eyes: Conjunctivae are normal. Right eye exhibits no discharge. Left eye exhibits no discharge.  EOMs normal to gross examination.  Neck: Normal range of motion.  Cardiovascular: Normal rate and regular rhythm.  Intact, 2+ radial pulse of the LUE.  Pulmonary/Chest:  Normal respiratory effort. Patient converses comfortably. No audible wheeze or stridor.  Abdominal: She  exhibits no distension.  Musculoskeletal: Normal range of motion.  Patient exhibits an abscess of the ulnar aspect of the left middle finger consistent with paronychia.  There is mild swelling symmetrically of the distal tip of the finger with no fluctuance or tenderness of the pad, no fusiform swelling of the digit, no erythema, and no pain over the flexor surface.  Patient is able to fully flex and extend all fingers of the left hand without difficulty.  Sensation is intact to light touch to the distal tip of the left middle finger.  Neurological: She is alert.  Cranial nerves intact to gross observation. Patient moves extremities without difficulty.  Skin: Skin is warm and dry. She is not diaphoretic.  Psychiatric: She has a normal mood and affect. Her behavior is normal. Judgment and thought  content normal.  Nursing note and vitals reviewed.    ED Treatments / Results  Labs (all labs ordered are listed, but only abnormal results are displayed) Labs Reviewed  POC URINE PREG, ED    EKG None  Radiology No results found.  Procedures .Marland KitchenIncision and Drainage Date/Time: 05/22/2018 5:48 PM Performed by: Elisha Ponder, PA-C Authorized by: Elisha Ponder, PA-C   Consent:    Consent obtained:  Verbal   Consent given by:  Patient   Risks discussed:  Incomplete drainage, pain and infection Location:    Indications for incision and drainage: paronychia.   Location: Left middle finger. Pre-procedure details:    Skin preparation:  Betadine Anesthesia (see MAR for exact dosages):    Anesthesia method:  Nerve block   Block location:  Digital block   Block needle gauge:  25 G   Block anesthetic:  Lidocaine 1% w/o epi   Block injection procedure:  Anatomic landmarks identified, introduced needle, incremental injection, negative aspiration for blood and anatomic landmarks palpated   Block outcome:  Anesthesia achieved Procedure type:    Complexity:  Simple Procedure details:    Incision types:  Stab incision   Incision depth:  Dermal   Scalpel blade:  11   Drainage:  Purulent   Drainage amount:  Moderate   Wound treatment:  Wound left open Post-procedure details:    Patient tolerance of procedure:  Tolerated well, no immediate complications (Nonadherent dressing in place.)   (including critical care time)  Medications Ordered in ED Medications - No data to display   Initial Impression / Assessment and Plan / ED Course  I have reviewed the triage vital signs and the nursing notes.  Pertinent labs & imaging results that were available during my care of the patient were reviewed by me and considered in my medical decision making (see chart for details).     Patient nontoxic-appearing and in no acute distress.  Patient exhibits paronychia of the left middle  finger.  No evidence of felon.  No evidence of flexor tenosynovitis per absence of [place] signs.  Patient without immunocompromising conditions.  I&D performed successfully with full drainage.  Encouraged warm soaks of the finger with diluted chlorhexidine.  Patient is in understanding and agrees with the plan of care.  Final Clinical Impressions(s) / ED Diagnoses   Final diagnoses:  Paronychia of left middle finger    ED Discharge Orders        Ordered    Chlorhexidine Gluconate 2 % SOLN  Daily     05/22/18 1306       Delia Chimes 05/22/18 1752    Nira Conn, MD 05/23/18  0719  

## 2018-05-22 NOTE — Discharge Instructions (Signed)
Please see the information and instructions below regarding your visit.  Your diagnoses today include:  1. Paronychia of left middle finger     Tests performed today include: See side panel of your discharge paperwork for testing performed today. Vital signs are listed at the bottom of these instructions.   Medications prescribed:    Take any prescribed medications only as prescribed, and any over the counter medications only as directed on the packaging.  Please mix the chlorhexidine solution with warm water, and soak at least 4 times daily.  Home care instructions:  Please follow any educational materials contained in this packet.   Please keep a dressing on this at all times until the wound scabs over.  Please keep it covered at all times especially if you are going to have an exposure to the wound such as in a salon.  Follow-up instructions: Please follow-up with your primary care provider for further evaluation of your symptoms if they are not completely improved.   Please follow up with this ED if you have any increase in swelling or pain.  Return instructions:  Please return to the Emergency Department if you experience worsening symptoms.  Please return to the emergency department if you develop any increase in swelling of the finger, redness of the finger, inability to flex or extend the finger, or redevelopment of abscess. Please return if you have any other emergent concerns.  Additional Information:   Your vital signs today were: BP 126/88 (BP Location: Right Arm)    Pulse 71    Temp 98 F (36.7 C) (Oral)    Resp 18    SpO2 100%  If your blood pressure (BP) was elevated on multiple readings during this visit above 130 for the top number or above 80 for the bottom number, please have this repeated by your primary care provider within one month. --------------  Thank you for allowing [place] to participate in your care today.

## 2018-05-22 NOTE — ED Triage Notes (Addendum)
Patient here from home with complaints of left middle finger pain since last Thursday. Inflammation noted.

## 2019-07-14 ENCOUNTER — Other Ambulatory Visit: Payer: Self-pay

## 2019-07-14 ENCOUNTER — Emergency Department (HOSPITAL_COMMUNITY)
Admission: EM | Admit: 2019-07-14 | Discharge: 2019-07-15 | Disposition: A | Discharge status: Home / Self Care | Payer: Self-pay | Attending: Emergency Medicine | Admitting: Emergency Medicine

## 2019-07-14 DIAGNOSIS — T7840XA Allergy, unspecified, initial encounter: Secondary | ICD-10-CM | POA: Insufficient documentation

## 2019-07-14 MED ORDER — FAMOTIDINE 20 MG PO TABS
20.0000 mg | ORAL_TABLET | Freq: Once | ORAL | Status: AC
Start: 2019-07-14 — End: 2019-07-14
  Administered 2019-07-14: 20 mg via ORAL
  Filled 2019-07-14: qty 1

## 2019-07-14 MED ORDER — PREDNISONE 20 MG PO TABS
60.0000 mg | ORAL_TABLET | Freq: Once | ORAL | Status: AC
  Administered 2019-07-14: 60 mg via ORAL
  Filled 2019-07-14: qty 3

## 2019-07-14 NOTE — ED Provider Notes (Signed)
[facility]   CSN: [id number] Arrival date & time: 07/14/19  2303    History   Chief Complaint Chief Complaint  Patient presents with  . Allergic Reaction    HPI Colleen Schmidt is a 33 y.o. female.     HPI   Patient is a 33 year old female who presents the emergency department today for evaluation of allergic reaction.  She states that she dyed her hair earlier today and following using this dye she felt that her scalp was itchy/irritated.  States that her lip started to feel numb and she drove to the store to get a Benadryl.  On the way to the store she felt like her throat was closing.  States she looked in the mirror and she did not have any evidence of lip swelling or tongue swelling.  After taking the Benadryl she went home.  She vomited after arriving home but she does not think that she vomited up the Benadryl.  She states that the tingling in her mouth and the feeling of throat swelling have completely resolved.  She denies that she ever felt short of breath or had any wheezing.  She denies that she had any rashes to her bilateral upper or lower extremities.  She has no history of anaphylaxis.  Past Medical History:  Diagnosis Date  . Medical history non-contributory   . Pregnant   . Trichomonas     Patient Active Problem List   Diagnosis Date Noted  . Vitiligo 01/03/2018  . CIN I (cervical intraepithelial neoplasia I) 06/17/2014  . Low grade squamous intraepithelial lesion (LGSIL) on Papanicolaou smear of cervix 06/03/2014  . Normocytic anemia 12/09/2013    Past Surgical History:  Procedure Laterality Date  . WISDOM TOOTH EXTRACTION       OB History    Gravida  2   Para  2   Term  2   Preterm      AB      Living  2     SAB      TAB      Ectopic      Multiple      Live Births  2            Home Medications    Prior to Admission medications   Medication Sig Start Date End  Date Taking? Authorizing Provider  Chlorhexidine Gluconate 2 % SOLN Apply 10 mLs topically daily. 05/22/18   Aviva KluverMurray, Alyssa B, PA-C  diphenhydrAMINE-zinc acetate (BENADRYL EXTRA STRENGTH) cream Apply 1 application topically 3 (three) times daily as needed for itching. 07/15/19   Kendre Jacinto S, PA-C  predniSONE (STERAPRED UNI-PAK 21 TAB) 10 MG (21) TBPK tablet Take by mouth daily. Take 6 tabs by mouth daily  for 2 days, then 5 tabs for 2 days, then 4 tabs for 2 days, then 3 tabs for 2 days, 2 tabs for 2 days, then 1 tab by mouth daily for 2 days 07/15/19   Rice Walsh S, PA-C    Family History Family History  Problem Relation Age of Onset  . Hypertension Mother   . Cancer Maternal Grandmother        lung  . Breast cancer Maternal Grandmother   . Hypertension Paternal Grandmother   . Cancer Paternal Grandfather     Social History Social History   Tobacco Use  . Smoking status: Never Smoker  . Smokeless tobacco: Never Used  Substance Use Topics  . Alcohol use:  No    Frequency: Never  . Drug use: No     Allergies   Patient has no known allergies.   Review of Systems Review of Systems  Constitutional: Negative for fever.  HENT: Negative for sore throat.        Sensation of throat swelling  Eyes: Negative for visual disturbance.  Respiratory: Negative for cough and shortness of breath.   Cardiovascular: Negative for chest pain.  Gastrointestinal: Positive for vomiting. Negative for abdominal pain.  Genitourinary: Negative for dysuria.  Musculoskeletal: Negative for back pain.  Skin: Negative for rash.  Neurological: Positive for numbness (perioral). Negative for headaches.  All other systems reviewed and are negative.    Physical Exam Updated Vital Signs BP (!) 143/83   Pulse 82   Temp 98.4 F (36.9 C)   Resp 16   Ht 5\' 2"  (1.575 m)   Wt 66.2 kg   SpO2 100%   BMI 26.70 kg/m   Physical Exam Vitals signs and nursing note reviewed.  Constitutional:       General: She is not in acute distress.    Appearance: She is well-developed.  HENT:     Head: Normocephalic and atraumatic.     Mouth/Throat:     Mouth: Mucous membranes are moist.     Pharynx: No posterior oropharyngeal erythema.     Comments: No angioedema.  Normal voice.  No stridor. Eyes:     Conjunctiva/sclera: Conjunctivae normal.  Neck:     Musculoskeletal: Neck supple.  Cardiovascular:     Rate and Rhythm: Normal rate and regular rhythm.     Pulses: Normal pulses.     Heart sounds: Normal heart sounds. No murmur.  Pulmonary:     Effort: Pulmonary effort is normal. No respiratory distress.     Breath sounds: Normal breath sounds. No wheezing, rhonchi or rales.  Abdominal:     General: Bowel sounds are normal.     Palpations: Abdomen is soft.     Tenderness: There is no abdominal tenderness.  Skin:    General: Skin is warm and dry.     Comments: Multiple small skin colored papules to the scalp along the front aspect of the hairline.  No urticaria noted.  Neurological:     Mental Status: She is alert.      ED Treatments / Results  Labs (all labs ordered are listed, but only abnormal results are displayed) Labs Reviewed - No data to display  EKG None  Radiology No results found.  Procedures Procedures (including critical care time)  Medications Ordered in ED Medications  predniSONE (DELTASONE) tablet 60 mg (60 mg Oral Given 07/14/19 2348)  famotidine (PEPCID) tablet 20 mg (20 mg Oral Given 07/14/19 2348)     Initial Impression / Assessment and Plan / ED Course  I have reviewed the triage vital signs and the nursing notes.  Pertinent labs & imaging results that were available during my care of the patient were reviewed by me and considered in my medical decision making (see chart for details).   Final Clinical Impressions(s) / ED Diagnoses   Final diagnoses:  Allergic reaction, initial encounter   Patient presenting with symptoms of an allergic  reaction.  On arrival she states that most of the symptoms have resolved with the exception of itching/irritation to the hairline.  On exam she has no angioedema.  No stridor.  Normal lung sounds without any evidence of wheezing.  She is in no acute distress.  She did  take Benadryl prior to arrival.  I will give Pepcid and prednisone and observe her.    Pt observed in the ED for several hours. Sxs have completely resolved.  Patient re-evaluated prior to dc, is hemodynamically stable, in no respiratory distress, and denies the feeling of throat closing. Pt has been advised to take OTC benadryl & return to the ED if they have a mod-severe allergic rxn (s/s including throat closing, difficulty breathing, swelling of lips face or tongue). Pt is to follow up with their PCP. Pt is agreeable with plan & verbalizes understanding.   ED Discharge Orders         Ordered    predniSONE (STERAPRED UNI-PAK 21 TAB) 10 MG (21) TBPK tablet  Daily     07/15/19 0054    diphenhydrAMINE-zinc acetate (BENADRYL EXTRA STRENGTH) cream  3 times daily PRN     07/15/19 0054           Rodney Booze, PA-C 07/15/19 0055    Isla Pence, MD 07/15/19 2259

## 2019-07-14 NOTE — ED Triage Notes (Signed)
Patient c/o allergic reaction/scalp irritability after coloring her hair. States that she took (1) generic allergy pill and some of the symptoms (rash, itching) have resolved but not all.

## 2019-07-15 MED ORDER — DIPHENHYDRAMINE-ZINC ACETATE 2-0.1 % EX CREA: 1.0000 "application " | TOPICAL_CREAM | Freq: Three times a day (TID) | CUTANEOUS | 0 refills | Status: AC | PRN

## 2019-07-15 MED ORDER — PREDNISONE 10 MG (21) PO TBPK: ORAL_TABLET | Freq: Every day | ORAL | 0 refills | Status: AC

## 2019-07-15 NOTE — Discharge Instructions (Addendum)
Please use medications as directed.   If you start having any shortness of breath, wheezing, throat swelling, facial swelling or any new or worsening symptoms then you need to take benadryl and come the emergency department immediately.

## 2019-11-21 ENCOUNTER — Other Ambulatory Visit: Payer: Self-pay

## 2019-11-21 DIAGNOSIS — Z20822 Contact with and (suspected) exposure to covid-19: Secondary | ICD-10-CM

## 2019-11-23 LAB — NOVEL CORONAVIRUS, NAA: SARS-CoV-2, NAA: NOT DETECTED

## 2021-08-30 ENCOUNTER — Emergency Department (HOSPITAL_COMMUNITY): Admission: EM | Admit: 2021-08-30 | Discharge: 2021-08-30 | Discharge status: Against Medical Advice | Payer: Self-pay

## 2021-08-30 ENCOUNTER — Other Ambulatory Visit: Payer: Self-pay

## 2021-09-17 ENCOUNTER — Emergency Department (HOSPITAL_COMMUNITY)
Admission: EM | Admit: 2021-09-17 | Discharge: 2021-09-17 | Disposition: A | Discharge status: Home / Self Care | Payer: No Typology Code available for payment source | Attending: Emergency Medicine | Admitting: Emergency Medicine

## 2021-09-17 ENCOUNTER — Encounter (HOSPITAL_COMMUNITY): Payer: Self-pay

## 2021-09-17 ENCOUNTER — Emergency Department (HOSPITAL_COMMUNITY): Payer: No Typology Code available for payment source

## 2021-09-17 ENCOUNTER — Other Ambulatory Visit: Payer: Self-pay

## 2021-09-17 DIAGNOSIS — M542 Cervicalgia: Secondary | ICD-10-CM | POA: Insufficient documentation

## 2021-09-17 DIAGNOSIS — Y92410 Unspecified street and highway as the place of occurrence of the external cause: Secondary | ICD-10-CM | POA: Insufficient documentation

## 2021-09-17 DIAGNOSIS — M25512 Pain in left shoulder: Secondary | ICD-10-CM | POA: Insufficient documentation

## 2021-09-17 MED ORDER — METHOCARBAMOL 500 MG PO TABS: 500.0000 mg | ORAL_TABLET | Freq: Three times a day (TID) | ORAL | 0 refills | Status: AC | PRN

## 2021-09-17 MED ORDER — LIDOCAINE 5 % EX PTCH: 1.0000 | MEDICATED_PATCH | CUTANEOUS | 0 refills | Status: AC
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
## 2021-09-29 ENCOUNTER — Other Ambulatory Visit: Payer: Self-pay

## 2021-09-29 ENCOUNTER — Encounter: Payer: Self-pay | Admitting: Physical Therapy

## 2021-09-29 ENCOUNTER — Ambulatory Visit: Payer: Self-pay | Attending: Orthopedic Surgery | Admitting: Physical Therapy

## 2021-09-29 DIAGNOSIS — M25512 Pain in left shoulder: Secondary | ICD-10-CM | POA: Insufficient documentation

## 2021-09-29 DIAGNOSIS — M6281 Muscle weakness (generalized): Secondary | ICD-10-CM | POA: Insufficient documentation

## 2021-09-29 DIAGNOSIS — R293 Abnormal posture: Secondary | ICD-10-CM | POA: Insufficient documentation

## 2021-09-29 DIAGNOSIS — M542 Cervicalgia: Secondary | ICD-10-CM | POA: Insufficient documentation

## 2021-09-29 NOTE — Patient Instructions (Signed)
Access Code: [email or web address] URL: [email or web address] Date: 09/29/2021 Prepared by: Alphonzo Severance  Exercises Standing Isometric Cervical Sidebending with Manual Resistance - 2 x daily - 7 x weekly - 1 sets - 10 reps - 5'' hold Seated Cervical Sidebending Stretch - 2 x daily - 7 x weekly - 1 sets - 3 reps - 30 hold Standing Row with Resistance - 2 x daily - 7 x weekly - 3 sets - 10 reps
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
## 2021-10-06 ENCOUNTER — Encounter: Payer: Self-pay | Admitting: Physical Therapy

## 2021-10-06 ENCOUNTER — Ambulatory Visit: Payer: Self-pay | Admitting: Physical Therapy

## 2021-10-06 ENCOUNTER — Other Ambulatory Visit: Payer: Self-pay

## 2021-10-06 DIAGNOSIS — M542 Cervicalgia: Secondary | ICD-10-CM

## 2021-10-06 DIAGNOSIS — M25512 Pain in left shoulder: Secondary | ICD-10-CM

## 2021-10-06 DIAGNOSIS — M6281 Muscle weakness (generalized): Secondary | ICD-10-CM

## 2021-10-06 DIAGNOSIS — R293 Abnormal posture: Secondary | ICD-10-CM
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
## 2021-10-06 NOTE — Patient Instructions (Signed)

## 2021-10-13 ENCOUNTER — Encounter: Payer: Self-pay | Admitting: Physical Therapy

## 2021-10-13 ENCOUNTER — Other Ambulatory Visit: Payer: Self-pay

## 2021-10-13 ENCOUNTER — Ambulatory Visit: Payer: Self-pay | Admitting: Physical Therapy

## 2021-10-13 DIAGNOSIS — R293 Abnormal posture: Secondary | ICD-10-CM

## 2021-10-13 DIAGNOSIS — M542 Cervicalgia: Secondary | ICD-10-CM

## 2021-10-13 DIAGNOSIS — M25512 Pain in left shoulder: Secondary | ICD-10-CM

## 2021-10-13 DIAGNOSIS — M6281 Muscle weakness (generalized): Secondary | ICD-10-CM
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
## 2021-10-13 NOTE — Patient Instructions (Signed)
       Garen Lah, PT, [facility] Certified Exercise Expert for the Aging Adult  10/13/21 9:23 AM Phone: [phone] Fax: [phone]

## 2021-10-20 ENCOUNTER — Ambulatory Visit: Payer: Self-pay | Admitting: Physical Therapy

## 2021-10-27 ENCOUNTER — Encounter: Payer: Self-pay | Admitting: Physical Therapy

## 2021-11-03 ENCOUNTER — Ambulatory Visit: Payer: No Typology Code available for payment source | Admitting: Physical Therapy

## 2021-11-10 ENCOUNTER — Encounter: Payer: Self-pay | Admitting: Physical Therapy

## 2021-11-17 ENCOUNTER — Encounter: Payer: Self-pay | Admitting: Physical Therapy
